# Patient Record
Sex: Female | Born: 1935 | Race: Black or African American | Hispanic: No | Smoking: Never smoker
Health system: Southern US, Community
[De-identification: ages and names within clinical notes are randomized; demographics above are authoritative.]

## PROBLEM LIST (undated history)

## (undated) DIAGNOSIS — I10 Essential (primary) hypertension: Secondary | ICD-10-CM

## (undated) DIAGNOSIS — R42 Dizziness and giddiness: Secondary | ICD-10-CM

## (undated) DIAGNOSIS — F0390 Unspecified dementia without behavioral disturbance: Secondary | ICD-10-CM

---

## 2018-06-01 ENCOUNTER — Emergency Department (HOSPITAL_COMMUNITY)
Admission: EM | Admit: 2018-06-01 | Discharge: 2018-06-02 | Disposition: A | Discharge status: Home / Self Care | Payer: Medicare Other | Attending: Emergency Medicine | Admitting: Emergency Medicine

## 2018-06-01 ENCOUNTER — Other Ambulatory Visit: Payer: Self-pay

## 2018-06-01 ENCOUNTER — Encounter (HOSPITAL_COMMUNITY): Payer: Self-pay | Admitting: Emergency Medicine

## 2018-06-01 DIAGNOSIS — N390 Urinary tract infection, site not specified: Secondary | ICD-10-CM | POA: Diagnosis not present

## 2018-06-01 DIAGNOSIS — I10 Essential (primary) hypertension: Secondary | ICD-10-CM | POA: Diagnosis not present

## 2018-06-01 DIAGNOSIS — R197 Diarrhea, unspecified: Secondary | ICD-10-CM | POA: Diagnosis present

## 2018-06-01 DIAGNOSIS — N3000 Acute cystitis without hematuria: Secondary | ICD-10-CM

## 2018-06-01 DIAGNOSIS — F0390 Unspecified dementia without behavioral disturbance: Secondary | ICD-10-CM | POA: Diagnosis not present

## 2018-06-01 HISTORY — DX: Essential (primary) hypertension: I10

## 2018-06-01 HISTORY — DX: Dizziness and giddiness: R42

## 2018-06-01 HISTORY — DX: Unspecified dementia, unspecified severity, without behavioral disturbance, psychotic disturbance, mood disturbance, and anxiety: F03.90

## 2018-06-01 LAB — URINALYSIS, ROUTINE W REFLEX MICROSCOPIC
Bilirubin Urine: NEGATIVE
Glucose, UA: NEGATIVE mg/dL
Ketones, ur: NEGATIVE mg/dL
Nitrite: NEGATIVE
Protein, ur: NEGATIVE mg/dL
Specific Gravity, Urine: 1.012 (ref 1.005–1.030)
WBC, UA: >50 WBC/hpf — ABNORMAL HIGH (ref 0–5)
pH: 5 (ref 5.0–8.0)

## 2018-06-01 NOTE — ED Notes (Signed)
Bed: EN40 Expected date:  Expected time:  Means of arrival:  Comments: EMS 83 yo female from SNF-diarrhea 130/72 CBG 103 Temp 100

## 2018-06-01 NOTE — ED Triage Notes (Signed)
Patient arrives by [facility] from [facility] stated foul smelling diarrhea that started today-went multiple times-denies any abd pain or vomiting-also complaining of increased swelling to lower extremities.

## 2018-06-01 NOTE — ED Provider Notes (Signed)
[facility]-EMERGENCY DEPT Provider Note   CSN: [id number] Arrival date & time: 06/01/18  2207    History   Chief Complaint Chief Complaint  Patient presents with  . Diarrhea    HPI Taylor Ruiz is a 83 y.o. female.     Patient to ED from [facility]) with complaint of diarrhea. She reports 2-3 episodes of copious, non-bloody stools today. She denies abdominal pain, known fever, nausea, vomiting. She states she has had a normal appetite today. No urinary symptoms. No respiratory symptoms. She is unsure whether or not she has been on any recent antibiotics, or had any medication changes.  Patient has documented history of dementia but is oriented to person and time here. Attempts to contact [facility] for additional history unsuccessful.  The history is provided by the patient and the EMS personnel. No language interpreter was used.  Diarrhea  Associated symptoms: no abdominal pain, no fever and no vomiting     Past Medical History:  Diagnosis Date  . Dementia (HCC)   . HTN (hypertension)   . Vertigo     There are no active problems to display for this patient.     OB History   No obstetric history on file.      Home Medications    Prior to Admission medications   Not on File    Family History No family history on file.  Social History Social History   Tobacco Use  . Smoking status: Not on file  Substance Use Topics  . Alcohol use: Not on file  . Drug use: Not on file     Allergies   Patient has no allergy information on record.   Review of Systems Review of Systems  Constitutional: Negative for fever.  HENT: Negative.   Respiratory: Negative.   Gastrointestinal: Positive for diarrhea. Negative for abdominal pain, nausea and vomiting.  Genitourinary: Negative for decreased urine volume and dysuria.  Neurological: Negative for weakness and light-headedness.     Physical Exam Updated Vital  Signs BP 131/64 (BP Location: Left Arm)   Pulse 94   Temp 99 F (37.2 C) (Rectal)   Resp 16   SpO2 99%   Physical Exam Vitals signs and nursing note reviewed.  Constitutional:      General: She is not in acute distress.    Appearance: She is not ill-appearing.  HENT:     Head: Normocephalic.     Mouth/Throat:     Mouth: Mucous membranes are moist.  Neck:     Musculoskeletal: Normal range of motion and neck supple.  Cardiovascular:     Rate and Rhythm: Normal rate and regular rhythm.     Heart sounds: No murmur.  Pulmonary:     Effort: Pulmonary effort is normal.     Breath sounds: No wheezing, rhonchi or rales.  Chest:     Chest wall: No tenderness.  Abdominal:     General: There is no distension.     Palpations: Abdomen is soft.     Tenderness: There is no abdominal tenderness. There is no guarding or rebound.  Musculoskeletal: Normal range of motion.  Skin:    General: Skin is warm and dry.  Neurological:     Mental Status: She is alert.      ED Treatments / Results  Labs (all labs ordered are listed, but only abnormal results are displayed) Labs Reviewed  URINE CULTURE  CBC WITH DIFFERENTIAL/PLATELET  COMPREHENSIVE METABOLIC PANEL  URINALYSIS,  ROUTINE W REFLEX MICROSCOPIC   Results for orders placed or performed during the hospital encounter of 06/01/18  CBC with Differential  Result Value Ref Range   WBC 8.0 4.0 - 10.5 K/uL   RBC 3.63 (L) 3.87 - 5.11 MIL/uL   Hemoglobin 11.4 (L) 12.0 - 15.0 g/dL   HCT 16.135.0 (L) 09.636.0 - 04.546.0 %   MCV 96.4 80.0 - 100.0 fL   MCH 31.4 26.0 - 34.0 pg   MCHC 32.6 30.0 - 36.0 g/dL   RDW 40.914.6 81.111.5 - 91.415.5 %   Platelets 217 150 - 400 K/uL   nRBC 0.0 0.0 - 0.2 %   Neutrophils Relative % 68 %   Neutro Abs 5.4 1.7 - 7.7 K/uL   Lymphocytes Relative 21 %   Lymphs Abs 1.6 0.7 - 4.0 K/uL   Monocytes Relative 8 %   Monocytes Absolute 0.7 0.1 - 1.0 K/uL   Eosinophils Relative 2 %   Eosinophils Absolute 0.2 0.0 - 0.5 K/uL    Basophils Relative 0 %   Basophils Absolute 0.0 0.0 - 0.1 K/uL   Immature Granulocytes 1 %   Abs Immature Granulocytes 0.04 0.00 - 0.07 K/uL  Comprehensive metabolic panel  Result Value Ref Range   Sodium 140 135 - 145 mmol/L   Potassium 3.6 3.5 - 5.1 mmol/L   Chloride 109 98 - 111 mmol/L   CO2 21 (L) 22 - 32 mmol/L   Glucose, Bld 85 70 - 99 mg/dL   BUN 13 8 - 23 mg/dL   Creatinine, Ser 7.820.88 0.44 - 1.00 mg/dL   Calcium 9.2 8.9 - 95.610.3 mg/dL   Total Protein 7.2 6.5 - 8.1 g/dL   Albumin 3.3 (L) 3.5 - 5.0 g/dL   AST 14 (L) 15 - 41 U/L   ALT 11 0 - 44 U/L   Alkaline Phosphatase 50 38 - 126 U/L   Total Bilirubin 0.6 0.3 - 1.2 mg/dL   GFR calc non Af Amer >60 >60 mL/min   GFR calc Af Amer >60 >60 mL/min   Anion gap 10 5 - 15  Urinalysis, Routine w reflex microscopic  Result Value Ref Range   Color, Urine YELLOW YELLOW   APPearance HAZY (A) CLEAR   Specific Gravity, Urine 1.012 1.005 - 1.030   pH 5.0 5.0 - 8.0   Glucose, UA NEGATIVE NEGATIVE mg/dL   Hgb urine dipstick SMALL (A) NEGATIVE   Bilirubin Urine NEGATIVE NEGATIVE   Ketones, ur NEGATIVE NEGATIVE mg/dL   Protein, ur NEGATIVE NEGATIVE mg/dL   Nitrite NEGATIVE NEGATIVE   Leukocytes,Ua LARGE (A) NEGATIVE   RBC / HPF 0-5 0 - 5 RBC/hpf   WBC, UA >50 (H) 0 - 5 WBC/hpf   Bacteria, UA MANY (A) NONE SEEN   Squamous Epithelial / LPF 0-5 0 - 5   Mucus PRESENT     EKG None  Radiology No results found.  Procedures Procedures (including critical care time)  Medications Ordered in ED Medications - No data to display   Initial Impression / Assessment and Plan / ED Course  I have reviewed the triage vital signs and the nursing notes.  Pertinent labs & imaging results that were available during my care of the patient were reviewed by me and considered in my medical decision making (see chart for details).        Patient to ED from [facility] with complaint of diarrhea. She reports having 2 episodes of non-bloody stools today. No  reported fever, nausea or vomiting.  The patient is alert and very pleasant. She is oriented and the history is felt reliable. Her exam is benign.   She is found to have a urinary tract infection which has been treated here with Rocephin. Other labs are non-concerning and do not suggest concern for sepsis, dehydration.   The patient was updated on results and plan for discharge back to [facility]. Of note, Chip Boer was called earlier tonight on patient's arrival to gain collateral information and I was prompted to leave a message "requiring immediate call back". I never received any call from any staff member regarding this patient.  Final Clinical Impressions(s) / ED Diagnoses   Final diagnoses:  None   1. UTI  ED Discharge Orders    None       Danne Harbor 06/02/18 0247    Benjiman Core, MD 06/02/18 2321

## 2018-06-01 NOTE — ED Notes (Signed)
Taylor Ruiz (cousin) [phone]

## 2018-06-02 DIAGNOSIS — N390 Urinary tract infection, site not specified: Secondary | ICD-10-CM | POA: Diagnosis not present

## 2018-06-02 LAB — CBC WITH DIFFERENTIAL/PLATELET
Abs Immature Granulocytes: 0.04 10*3/uL (ref 0.00–0.07)
Basophils Absolute: 0 10*3/uL (ref 0.0–0.1)
Basophils Relative: 0 %
Eosinophils Absolute: 0.2 10*3/uL (ref 0.0–0.5)
Eosinophils Relative: 2 %
HCT: 35 % — ABNORMAL LOW (ref 36.0–46.0)
Hemoglobin: 11.4 g/dL — ABNORMAL LOW (ref 12.0–15.0)
Immature Granulocytes: 1 %
Lymphocytes Relative: 21 %
Lymphs Abs: 1.6 10*3/uL (ref 0.7–4.0)
MCH: 31.4 pg (ref 26.0–34.0)
MCHC: 32.6 g/dL (ref 30.0–36.0)
MCV: 96.4 fL (ref 80.0–100.0)
Monocytes Absolute: 0.7 10*3/uL (ref 0.1–1.0)
Monocytes Relative: 8 %
Neutro Abs: 5.4 10*3/uL (ref 1.7–7.7)
Neutrophils Relative %: 68 %
Platelets: 217 10*3/uL (ref 150–400)
RBC: 3.63 MIL/uL — ABNORMAL LOW (ref 3.87–5.11)
RDW: 14.6 % (ref 11.5–15.5)
WBC: 8 10*3/uL (ref 4.0–10.5)
nRBC: 0 % (ref 0.0–0.2)

## 2018-06-02 LAB — COMPREHENSIVE METABOLIC PANEL
ALT: 11 U/L (ref 0–44)
AST: 14 U/L — ABNORMAL LOW (ref 15–41)
Albumin: 3.3 g/dL — ABNORMAL LOW (ref 3.5–5.0)
Alkaline Phosphatase: 50 U/L (ref 38–126)
Anion gap: 10 (ref 5–15)
BUN: 13 mg/dL (ref 8–23)
CO2: 21 mmol/L — ABNORMAL LOW (ref 22–32)
Calcium: 9.2 mg/dL (ref 8.9–10.3)
Chloride: 109 mmol/L (ref 98–111)
Creatinine, Ser: 0.88 mg/dL (ref 0.44–1.00)
GFR calc Af Amer: >60 mL/min (ref >60)
GFR calc non Af Amer: >60 mL/min (ref >60)
Glucose, Bld: 85 mg/dL (ref 70–99)
Potassium: 3.6 mmol/L (ref 3.5–5.1)
Sodium: 140 mmol/L (ref 135–145)
Total Bilirubin: 0.6 mg/dL (ref 0.3–1.2)
Total Protein: 7.2 g/dL (ref 6.5–8.1)

## 2018-06-02 MED ORDER — CEPHALEXIN 500 MG PO CAPS: 500.0000 mg | ORAL_CAPSULE | Freq: Three times a day (TID) | ORAL | 0 refills | Status: DC

## 2018-06-02 MED ORDER — SODIUM CHLORIDE 0.9 % IV SOLN
1.0000 g | Freq: Once | INTRAVENOUS | Status: AC
  Administered 2018-06-02: 1 g via INTRAVENOUS
  Filled 2018-06-02: qty 10

## 2018-06-02 NOTE — ED Notes (Addendum)
[facility] called for transport. Attempted to [place] [facility] [address], but got no answer. No episodes of diarrhea noted while at the facility.

## 2018-06-03 LAB — URINE CULTURE

## 2018-06-09 ENCOUNTER — Emergency Department (HOSPITAL_COMMUNITY): Payer: Medicare Other

## 2018-06-09 ENCOUNTER — Emergency Department (HOSPITAL_COMMUNITY)
Admission: EM | Admit: 2018-06-09 | Discharge: 2018-06-09 | Disposition: A | Discharge status: Skilled Nursing Facility | Payer: Medicare Other | Attending: Emergency Medicine | Admitting: Emergency Medicine

## 2018-06-09 ENCOUNTER — Encounter (HOSPITAL_COMMUNITY): Payer: Self-pay | Admitting: Physician Assistant

## 2018-06-09 DIAGNOSIS — I10 Essential (primary) hypertension: Secondary | ICD-10-CM | POA: Insufficient documentation

## 2018-06-09 DIAGNOSIS — R238 Other skin changes: Secondary | ICD-10-CM | POA: Diagnosis not present

## 2018-06-09 DIAGNOSIS — Y999 Unspecified external cause status: Secondary | ICD-10-CM | POA: Insufficient documentation

## 2018-06-09 DIAGNOSIS — S0990XA Unspecified injury of head, initial encounter: Secondary | ICD-10-CM | POA: Diagnosis present

## 2018-06-09 DIAGNOSIS — Y939 Activity, unspecified: Secondary | ICD-10-CM | POA: Diagnosis not present

## 2018-06-09 DIAGNOSIS — W19XXXA Unspecified fall, initial encounter: Secondary | ICD-10-CM

## 2018-06-09 DIAGNOSIS — Z79899 Other long term (current) drug therapy: Secondary | ICD-10-CM | POA: Diagnosis not present

## 2018-06-09 DIAGNOSIS — S199XXA Unspecified injury of neck, initial encounter: Secondary | ICD-10-CM | POA: Insufficient documentation

## 2018-06-09 DIAGNOSIS — Y929 Unspecified place or not applicable: Secondary | ICD-10-CM | POA: Diagnosis not present

## 2018-06-09 DIAGNOSIS — W07XXXA Fall from chair, initial encounter: Secondary | ICD-10-CM | POA: Diagnosis not present

## 2018-06-09 DIAGNOSIS — L988 Other specified disorders of the skin and subcutaneous tissue: Secondary | ICD-10-CM

## 2018-06-09 DIAGNOSIS — F0390 Unspecified dementia without behavioral disturbance: Secondary | ICD-10-CM | POA: Diagnosis not present

## 2018-06-09 LAB — CBC WITH DIFFERENTIAL/PLATELET
Abs Immature Granulocytes: 0.04 10*3/uL (ref 0.00–0.07)
Basophils Absolute: 0.1 10*3/uL (ref 0.0–0.1)
Basophils Relative: 1 %
Eosinophils Absolute: 0.1 10*3/uL (ref 0.0–0.5)
Eosinophils Relative: 1 %
HCT: 40.4 % (ref 36.0–46.0)
Hemoglobin: 12.8 g/dL (ref 12.0–15.0)
Immature Granulocytes: 0 %
Lymphocytes Relative: 16 %
Lymphs Abs: 1.5 10*3/uL (ref 0.7–4.0)
MCH: 30.5 pg (ref 26.0–34.0)
MCHC: 31.7 g/dL (ref 30.0–36.0)
MCV: 96.2 fL (ref 80.0–100.0)
Monocytes Absolute: 0.7 10*3/uL (ref 0.1–1.0)
Monocytes Relative: 8 %
Neutro Abs: 7.1 10*3/uL (ref 1.7–7.7)
Neutrophils Relative %: 74 %
Platelets: 183 10*3/uL (ref 150–400)
RBC: 4.2 MIL/uL (ref 3.87–5.11)
RDW: 14.2 % (ref 11.5–15.5)
WBC: 9.6 10*3/uL (ref 4.0–10.5)
nRBC: 0 % (ref 0.0–0.2)

## 2018-06-09 LAB — COMPREHENSIVE METABOLIC PANEL
ALT: 23 U/L (ref 0–44)
AST: 40 U/L (ref 15–41)
Albumin: 3.1 g/dL — ABNORMAL LOW (ref 3.5–5.0)
Alkaline Phosphatase: 48 U/L (ref 38–126)
Anion gap: 9 (ref 5–15)
BUN: 15 mg/dL (ref 8–23)
CO2: 24 mmol/L (ref 22–32)
Calcium: 9 mg/dL (ref 8.9–10.3)
Chloride: 110 mmol/L (ref 98–111)
Creatinine, Ser: 0.95 mg/dL (ref 0.44–1.00)
GFR calc Af Amer: >60 mL/min (ref >60)
GFR calc non Af Amer: 57 mL/min — ABNORMAL LOW (ref >60)
Glucose, Bld: 100 mg/dL — ABNORMAL HIGH (ref 70–99)
Potassium: 3.3 mmol/L — ABNORMAL LOW (ref 3.5–5.1)
Sodium: 143 mmol/L (ref 135–145)
Total Bilirubin: 0.7 mg/dL (ref 0.3–1.2)
Total Protein: 7.1 g/dL (ref 6.5–8.1)

## 2018-06-09 LAB — URINALYSIS, ROUTINE W REFLEX MICROSCOPIC
Bilirubin Urine: NEGATIVE
Glucose, UA: NEGATIVE mg/dL
Hgb urine dipstick: NEGATIVE
Ketones, ur: 5 mg/dL — AB
Leukocytes,Ua: NEGATIVE
Nitrite: NEGATIVE
Protein, ur: NEGATIVE mg/dL
Specific Gravity, Urine: 1.018 (ref 1.005–1.030)
pH: 6 (ref 5.0–8.0)

## 2018-06-09 LAB — TROPONIN I: Troponin I: <0.03 ng/mL (ref <0.03)

## 2018-06-09 MED ORDER — SODIUM CHLORIDE 0.9 % IV BOLUS
250.0000 mL | Freq: Once | INTRAVENOUS | Status: AC
  Administered 2018-06-09: 250 mL via INTRAVENOUS

## 2018-06-09 NOTE — ED Triage Notes (Signed)
PT BIBA from [facility] d/t witnessed fall yesterday from her wheelchair to the ground.  Staff this AM found abrasions to left inner thigh and lower left buttock.  Pt c/o lower back pain.   Staff uncertain as to whether she hit her head.  Pt has hx dementia. Pt does not take blood thinners.  Staff reports pt is at her baseline.  Denies LOC.

## 2018-06-09 NOTE — ED Notes (Signed)
In/out cath performed on pt, with 2nd RN present. Able to collect less than 1 mL of urine. PA Lanora Manis made aware.

## 2018-06-09 NOTE — Discharge Instructions (Signed)
Today the urine does not show any evidence of infection.  X-rays do not show any acute findings.  Please follow-up with primary care doctor.  Please make sure that the area around your brief is clean and dry.  Please follow-up with your primary care doctor.

## 2018-06-09 NOTE — ED Notes (Signed)
Bed: WA09 Expected date:  Expected time:  Means of arrival:  Comments: 83 yo fall, back pain

## 2018-06-09 NOTE — ED Notes (Signed)
Pt changed and ready for [facility]

## 2018-06-09 NOTE — ED Notes (Signed)
[facility] called for transport.  

## 2018-06-09 NOTE — ED Notes (Signed)
Lab notified staff that urine obtained was an inadequate volume to perform UA or urine culture testing.  PA Lanora Manis made aware.

## 2018-06-09 NOTE — ED Provider Notes (Signed)
[facility]-EMERGENCY DEPT Provider Note   CSN: [id number] Arrival date & time: 06/09/18  1332    History   Chief Complaint Chief Complaint  Patient presents with   Fall    HPI Glendora Clouatre is a 83 y.o. female with a past medical history of dementia, hypertension, seen here 7 days ago for UTI/diarrhea, who presents today for evaluation after a fall.  Patient reports that she was sitting in a chair yesterday and fell to the ground.  She is unsure why she fell.  According to EMS staff sent her here as they saw abrasions on her left inner thigh and lower left buttock.  Patient states that she did hit her head.  She reports pain in her upper and lower back along with both of her hips.  She denies any vision changes.  She denies any abdominal pain, chest pain, nausea vomiting or diarrhea.     HPI  Past Medical History:  Diagnosis Date   Dementia (HCC)    HTN (hypertension)    Vertigo     There are no active problems to display for this patient.   History reviewed. No pertinent surgical history.   OB History   No obstetric history on file.      Home Medications    Prior to Admission medications   Medication Sig Start Date End Date Taking? Authorizing Provider  acetaminophen (TYLENOL) 325 MG tablet Take 650 mg by mouth every 6 (six) hours as needed for mild pain, moderate pain, fever or headache.   Yes [provider]  amLODipine (NORVASC) 10 MG tablet Take 10 mg by mouth daily.   Yes [provider]  Brexpiprazole (REXULTI) 0.5 MG TABS Take 1 mg by mouth at bedtime.   Yes [provider]  cephALEXin (KEFLEX) 500 MG capsule Take 1 capsule (500 mg total) by mouth 3 (three) times daily. 06/02/18  Yes Upstill, Shari, PA-C  donepezil (ARICEPT) 5 MG tablet Take 5 mg by mouth at bedtime.   Yes [provider]  furosemide (LASIX) 20 MG tablet Take 20 mg by mouth daily.   Yes [provider]  meclizine  (ANTIVERT) 25 MG tablet Take 25 mg by mouth 2 (two) times daily.   Yes [provider]  pregabalin (LYRICA) 50 MG capsule Take 50 mg by mouth at bedtime.   Yes [provider]  valACYclovir (VALTREX) 500 MG tablet Take 500 mg by mouth 2 (two) times daily.   Yes [provider]    Family History History reviewed. No pertinent family history.  Social History Social History   Tobacco Use   Smoking status: Not on file  Substance Use Topics   Alcohol use: Not on file   Drug use: Not on file     Allergies   Patient has no known allergies.   Review of Systems Review of Systems  Constitutional: Negative for chills and fever.  Respiratory: Negative for chest tightness and shortness of breath.   Cardiovascular: Negative for chest pain.  Genitourinary: Negative for dysuria.  Musculoskeletal: Positive for back pain and neck pain.  Neurological: Negative for dizziness, speech difficulty and headaches.  All other systems reviewed and are negative.    Physical Exam Updated Vital Signs BP (!) 107/58    Pulse 91    Temp 98.9 F (37.2 C) (Oral)    Resp 16    SpO2 99%   Physical Exam Vitals signs and nursing note reviewed.  Constitutional:  General: She is not in acute distress.    Appearance: She is well-developed.  HENT:     Head: Normocephalic.     Comments: Small ecchymosis under left eye.     Nose: Nose normal.     Mouth/Throat:     Mouth: Mucous membranes are moist.  Eyes:     Conjunctiva/sclera: Conjunctivae normal.  Neck:     Musculoskeletal: Normal range of motion and neck supple.  Cardiovascular:     Rate and Rhythm: Normal rate and regular rhythm.     Heart sounds: No murmur.  Pulmonary:     Effort: Pulmonary effort is normal. No respiratory distress.     Breath sounds: Normal breath sounds.  Abdominal:     Palpations: Abdomen is soft.     Tenderness: There is no abdominal tenderness.  Musculoskeletal:     Comments: There is  diffuse tenderness to palpation over the mid to lower back.  There is midline tenderness to palpation, however no step-offs or deformities.  Pain is poorly localized.  There is pain with straight leg roll on the right hip and left hip.  No pain with pelvic compression.  Skin:    General: Skin is warm and dry.     Comments: Please see clinical images.  There are superficial/shallow ulcerations present on the right proximal medial thigh and on the left posterior leg/left lower buttock.  There is foul smell with thick, cheesy material in the skin fold around her brief.   Neurological:     General: No focal deficit present.     Mental Status: She is alert.  Psychiatric:        Mood and Affect: Mood normal.        Behavior: Behavior normal.      Posterior, left side is up.     Anterior    ED Treatments / Results  Labs (all labs ordered are listed, but only abnormal results are displayed) Labs Reviewed  URINALYSIS, ROUTINE W REFLEX MICROSCOPIC - Abnormal; Notable for the following components:      Result Value   APPearance HAZY (*)    Ketones, ur 5 (*)    All other components within normal limits  COMPREHENSIVE METABOLIC PANEL - Abnormal; Notable for the following components:   Potassium 3.3 (*)    Glucose, Bld 100 (*)    Albumin 3.1 (*)    GFR calc non Af Amer 57 (*)    All other components within normal limits  URINE CULTURE  CBC WITH DIFFERENTIAL/PLATELET  TROPONIN I    EKG EKG Interpretation  Date/Time:  Sunday June 09 2018 13:47:47 EDT Ventricular Rate:  96 PR Interval:    QRS Duration: 90 QT Interval:  352 QTC Calculation: 445 R Axis:   78 Text Interpretation:  Sinus rhythm Confirmed by Benjiman Core [phone]) on 06/09/2018 2:00:03 PM   Radiology Dg Thoracic Spine 2 View  Result Date: 06/09/2018 CLINICAL DATA:  Acute mid back pain following fall yesterday. Initial encounter. EXAM: THORACIC SPINE 2 VIEWS COMPARISON:  None. FINDINGS: There is no evidence of  thoracic spine fracture. Alignment is normal. No other significant bone abnormalities are identified. IMPRESSION: Negative. Electronically Signed   By: Harmon Pier M.D.   On: 06/09/2018 14:54   Dg Lumbar Spine Complete  Result Date: 06/09/2018 CLINICAL DATA:  Acute low back pain following fall yesterday. Initial encounter. EXAM: LUMBAR SPINE - COMPLETE 4+ VIEW COMPARISON:  None. FINDINGS: There is no evidence of acute fracture or subluxation. Mild-to-moderate  degenerative disc disease/spondylosis noted, greatest at L4-5. Moderate facet arthropathy in the LOWER lumbar spine noted. No focal bony lesions or spondylolysis noted. Aortic atherosclerotic calcifications noted. IMPRESSION: 1. No acute abnormality 2. Mild-to-moderate multilevel degenerative changes. 3.  Aortic Atherosclerosis (ICD10-I70.0). Electronically Signed   By: Harmon Pier M.D.   On: 06/09/2018 14:52   Ct Head Wo Contrast  Result Date: 06/09/2018 CLINICAL DATA:  83 year old female with acute head and neck injury following fall yesterday. Initial encounter. EXAM: CT HEAD WITHOUT CONTRAST CT CERVICAL SPINE WITHOUT CONTRAST TECHNIQUE: Multidetector CT imaging of the head and cervical spine was performed following the standard protocol without intravenous contrast. Multiplanar CT image reconstructions of the cervical spine were also generated. COMPARISON:  None. FINDINGS: CT HEAD FINDINGS Brain: No evidence of acute infarction, hemorrhage, hydrocephalus, extra-axial collection or mass lesion/mass effect. Mild atrophy and chronic small-vessel white matter ischemic changes noted. Vascular: Carotid atherosclerotic calcifications noted. Skull: Normal. Negative for fracture or focal lesion. Sinuses/Orbits: No acute finding. Other: None. CT CERVICAL SPINE FINDINGS Alignment: Normal. Skull base and vertebrae: No acute fracture. No primary bone lesion or focal pathologic process. Soft tissues and spinal canal: No prevertebral fluid or swelling. No visible  canal hematoma. Disc levels: Mild-to-moderate degenerative disc disease/spondylosis from C4-C7 noted contributing to mild to moderate central spinal narrowing. Upper chest: No acute abnormality Other: None IMPRESSION: 1. No evidence of acute intracranial abnormality. Mild atrophy and chronic small-vessel white matter ischemic changes. 2. No static evidence of acute injury to the cervical spine. Mild to moderate degenerative changes from C4-C7. Electronically Signed   By: Harmon Pier M.D.   On: 06/09/2018 14:46   Ct Cervical Spine Wo Contrast  Result Date: 06/09/2018 CLINICAL DATA:  83 year old female with acute head and neck injury following fall yesterday. Initial encounter. EXAM: CT HEAD WITHOUT CONTRAST CT CERVICAL SPINE WITHOUT CONTRAST TECHNIQUE: Multidetector CT imaging of the head and cervical spine was performed following the standard protocol without intravenous contrast. Multiplanar CT image reconstructions of the cervical spine were also generated. COMPARISON:  None. FINDINGS: CT HEAD FINDINGS Brain: No evidence of acute infarction, hemorrhage, hydrocephalus, extra-axial collection or mass lesion/mass effect. Mild atrophy and chronic small-vessel white matter ischemic changes noted. Vascular: Carotid atherosclerotic calcifications noted. Skull: Normal. Negative for fracture or focal lesion. Sinuses/Orbits: No acute finding. Other: None. CT CERVICAL SPINE FINDINGS Alignment: Normal. Skull base and vertebrae: No acute fracture. No primary bone lesion or focal pathologic process. Soft tissues and spinal canal: No prevertebral fluid or swelling. No visible canal hematoma. Disc levels: Mild-to-moderate degenerative disc disease/spondylosis from C4-C7 noted contributing to mild to moderate central spinal narrowing. Upper chest: No acute abnormality Other: None IMPRESSION: 1. No evidence of acute intracranial abnormality. Mild atrophy and chronic small-vessel white matter ischemic changes. 2. No static  evidence of acute injury to the cervical spine. Mild to moderate degenerative changes from C4-C7. Electronically Signed   By: Harmon Pier M.D.   On: 06/09/2018 14:46   Dg Hip Unilat With Pelvis 2-3 Views Left  Result Date: 06/09/2018 CLINICAL DATA:  Acute LEFT hip pain following fall yesterday. Initial encounter. EXAM: DG HIP (WITH OR WITHOUT PELVIS) 2-3V LEFT COMPARISON:  None. FINDINGS: There is no evidence of hip fracture or dislocation. There is no evidence of arthropathy or other focal bone abnormality. Degenerative changes in the LOWER lumbar spine noted. IMPRESSION: No acute abnormality. Electronically Signed   By: Harmon Pier M.D.   On: 06/09/2018 14:53    Procedures Procedures (including critical care  time)  Medications Ordered in ED Medications  sodium chloride 0.9 % bolus 250 mL (0 mLs Intravenous Stopped 06/09/18 1725)     Initial Impression / Assessment and Plan / ED Course  I have reviewed the triage vital signs and the nursing notes.  Pertinent labs & imaging results that were available during my care of the patient were reviewed by me and considered in my medical decision making (see chart for details).  Clinical Course as of Jun 09 1926  Sun Jun 09, 2018  1638 Patient has yet to urinate despite PO fluids.  Ordered small bolus of 250 ml, RN to push PO fluids.  Given recent UTI diagnosis Need to check urine before d/c.    [EH]    Clinical Course User Index [EH] Cristina GongHammond, Alanii Ramer W, PA-C      Patient presents today for evaluation after a fall that occurred yesterday at her facility when she fell out of her chair onto the floor.  Staff sent her today as she had bruising noted on her leg and buttocks.  The lesion appears to be skin breakdown secondary to maceration.  The patient tells me that she feels safe where she is living and no one has done anything to harm her.  Unsure why she fell.  CT head and neck were obtained without evidence of acute abnormalities.  She had hip  pain, and back pain.  Plain films of T and L-spine along with pelvis and left hip were obtained without evidence of acute abnormality.  Her urine does not show evidence of infection at this time.  Urine culture was sent.  EKG does not show evidence of acute abnormality.  Labs appear consistent with her baseline.  This patient was seen as a shared visit with Dr. Rubin PayorPickering.  Patient will be discharged home.  Return precautions were discussed with patient who states their understanding.  At the time of discharge patient denied any unaddressed complaints or concerns.  Patient is agreeable for discharge home.   Final Clinical Impressions(s) / ED Diagnoses   Final diagnoses:  Fall, initial encounter  Skin maceration    ED Discharge Orders    None       Norman ClayHammond, Summers Buendia W, PA-C 06/09/18 Lawanna [id number]    Pickering, Nathan, MD 06/12/18 [phone]

## 2018-06-11 LAB — URINE CULTURE: Culture: 30000 — AB

## 2018-06-12 ENCOUNTER — Telehealth: Payer: Self-pay | Admitting: *Deleted

## 2018-06-12 NOTE — Telephone Encounter (Signed)
Post ED Visit - Positive Culture Follow-up  Culture report reviewed by antimicrobial stewardship pharmacist: [facility] []  Enzo Bi, Pharm.D. []  Celedonio Miyamoto, Pharm.D., BCPS AQ-ID []  Garvin Fila, Pharm.D., BCPS []  Georgina Pillion, Pharm.D., BCPS []  [place], [address].D., BCPS, AAHIVP []  Estella Husk, Pharm.D., BCPS, AAHIVP []  Lysle Pearl, PharmD, BCPS []  Phillips Climes, PharmD, BCPS []  Agapito Games, PharmD, BCPS []  Verlan Friends, PharmD []  Mervyn Gay, PharmD, BCPS []  Vinnie Level, PharmD  [facility] []  Len Childs, PharmD []  Greer Pickerel, PharmD []  Adalberto Cole, PharmD []  Perlie Gold, Rph []  Lonell Face) Jean Rosenthal, PharmD []  Earl Many, PharmD []  Junita Push, PharmD []  Dorna Leitz, PharmD []  Terrilee Files, PharmD []  Lynann Beaver, PharmD []  Keturah Barre, PharmD [x]  Loralee Pacas, PharmD []  Bernadene Person, PharmD   Positive urine culture, reviewed by Philis Kendall, PA-C No further patient follow-up is required at this time.  Virl [facility] 06/12/2018, 11:39 AM

## 2018-06-24 ENCOUNTER — Emergency Department (HOSPITAL_COMMUNITY): Payer: Medicare Other

## 2018-06-24 ENCOUNTER — Other Ambulatory Visit: Payer: Self-pay

## 2018-06-24 ENCOUNTER — Encounter (HOSPITAL_COMMUNITY): Payer: Self-pay | Admitting: Family Medicine

## 2018-06-24 ENCOUNTER — Observation Stay (HOSPITAL_COMMUNITY)
Admission: EM | Admit: 2018-06-24 | Discharge: 2018-06-25 | Disposition: A | Discharge status: Home / Self Care | Payer: Medicare Other | Attending: Internal Medicine | Admitting: Internal Medicine

## 2018-06-24 DIAGNOSIS — Z1159 Encounter for screening for other viral diseases: Secondary | ICD-10-CM | POA: Insufficient documentation

## 2018-06-24 DIAGNOSIS — I10 Essential (primary) hypertension: Secondary | ICD-10-CM | POA: Diagnosis not present

## 2018-06-24 DIAGNOSIS — E876 Hypokalemia: Secondary | ICD-10-CM | POA: Insufficient documentation

## 2018-06-24 DIAGNOSIS — R7989 Other specified abnormal findings of blood chemistry: Secondary | ICD-10-CM | POA: Insufficient documentation

## 2018-06-24 DIAGNOSIS — R55 Syncope and collapse: Secondary | ICD-10-CM

## 2018-06-24 DIAGNOSIS — N289 Disorder of kidney and ureter, unspecified: Secondary | ICD-10-CM | POA: Diagnosis not present

## 2018-06-24 DIAGNOSIS — M50321 Other cervical disc degeneration at C4-C5 level: Secondary | ICD-10-CM | POA: Diagnosis not present

## 2018-06-24 DIAGNOSIS — I951 Orthostatic hypotension: Principal | ICD-10-CM | POA: Insufficient documentation

## 2018-06-24 DIAGNOSIS — M5136 Other intervertebral disc degeneration, lumbar region: Secondary | ICD-10-CM | POA: Diagnosis not present

## 2018-06-24 DIAGNOSIS — F0390 Unspecified dementia without behavioral disturbance: Secondary | ICD-10-CM | POA: Insufficient documentation

## 2018-06-24 DIAGNOSIS — R778 Other specified abnormalities of plasma proteins: Secondary | ICD-10-CM

## 2018-06-24 DIAGNOSIS — Z79899 Other long term (current) drug therapy: Secondary | ICD-10-CM | POA: Diagnosis not present

## 2018-06-24 DIAGNOSIS — I700 Atherosclerosis of aorta: Secondary | ICD-10-CM | POA: Diagnosis not present

## 2018-06-24 LAB — CBC WITH DIFFERENTIAL/PLATELET
Abs Immature Granulocytes: 0.03 10*3/uL (ref 0.00–0.07)
Basophils Absolute: 0 10*3/uL (ref 0.0–0.1)
Basophils Relative: 0 %
Eosinophils Absolute: 0 10*3/uL (ref 0.0–0.5)
Eosinophils Relative: 1 %
HCT: 36 % (ref 36.0–46.0)
Hemoglobin: 11.7 g/dL — ABNORMAL LOW (ref 12.0–15.0)
Immature Granulocytes: 0 %
Lymphocytes Relative: 11 %
Lymphs Abs: 0.9 10*3/uL (ref 0.7–4.0)
MCH: 31 pg (ref 26.0–34.0)
MCHC: 32.5 g/dL (ref 30.0–36.0)
MCV: 95.5 fL (ref 80.0–100.0)
Monocytes Absolute: 0.5 10*3/uL (ref 0.1–1.0)
Monocytes Relative: 6 %
Neutro Abs: 6.9 10*3/uL (ref 1.7–7.7)
Neutrophils Relative %: 82 %
Platelets: 189 10*3/uL (ref 150–400)
RBC: 3.77 MIL/uL — ABNORMAL LOW (ref 3.87–5.11)
RDW: 14.3 % (ref 11.5–15.5)
WBC: 8.5 10*3/uL (ref 4.0–10.5)
nRBC: 0 % (ref 0.0–0.2)

## 2018-06-24 LAB — BASIC METABOLIC PANEL
Anion gap: 9 (ref 5–15)
BUN: 14 mg/dL (ref 8–23)
CO2: 21 mmol/L — ABNORMAL LOW (ref 22–32)
Calcium: 9.2 mg/dL (ref 8.9–10.3)
Chloride: 107 mmol/L (ref 98–111)
Creatinine, Ser: 1.03 mg/dL — ABNORMAL HIGH (ref 0.44–1.00)
GFR calc Af Amer: 59 mL/min — ABNORMAL LOW (ref >60)
GFR calc non Af Amer: 51 mL/min — ABNORMAL LOW (ref >60)
Glucose, Bld: 113 mg/dL — ABNORMAL HIGH (ref 70–99)
Potassium: 3.3 mmol/L — ABNORMAL LOW (ref 3.5–5.1)
Sodium: 137 mmol/L (ref 135–145)

## 2018-06-24 LAB — TROPONIN I: Troponin I: 0.22 ng/mL (ref <0.03)

## 2018-06-24 MED ORDER — SODIUM CHLORIDE 0.9% FLUSH: 3.0000 mL | Freq: Two times a day (BID) | INTRAVENOUS | Status: DC

## 2018-06-24 MED ORDER — POTASSIUM CHLORIDE IN NACL 20-0.9 MEQ/L-% IV SOLN
INTRAVENOUS | Status: AC
  Administered 2018-06-25: 01:00:00 via INTRAVENOUS
  Filled 2018-06-24: qty 1000

## 2018-06-24 MED ORDER — ACETAMINOPHEN 650 MG RE SUPP: 650.0000 mg | Freq: Four times a day (QID) | RECTAL | Status: DC | PRN

## 2018-06-24 MED ORDER — SODIUM CHLORIDE 0.9% FLUSH
3.0000 mL | Freq: Two times a day (BID) | INTRAVENOUS | Status: DC
  Administered 2018-06-25: 3 mL via INTRAVENOUS

## 2018-06-24 MED ORDER — POTASSIUM CHLORIDE CRYS ER 20 MEQ PO TBCR
20.0000 meq | EXTENDED_RELEASE_TABLET | Freq: Once | ORAL | Status: AC
  Administered 2018-06-25: 20 meq via ORAL
  Filled 2018-06-24: qty 1

## 2018-06-24 MED ORDER — ASPIRIN 81 MG PO CHEW
324.0000 mg | CHEWABLE_TABLET | Freq: Once | ORAL | Status: AC
  Administered 2018-06-25: 324 mg via ORAL
  Filled 2018-06-24: qty 4

## 2018-06-24 MED ORDER — SODIUM CHLORIDE 0.9 % IV SOLN: 250.0000 mL | INTRAVENOUS | Status: DC | PRN

## 2018-06-24 MED ORDER — ONDANSETRON HCL 4 MG/2ML IJ SOLN: 4.0000 mg | Freq: Four times a day (QID) | INTRAMUSCULAR | Status: DC | PRN

## 2018-06-24 MED ORDER — ACETAMINOPHEN 325 MG PO TABS: 650.0000 mg | ORAL_TABLET | Freq: Four times a day (QID) | ORAL | Status: DC | PRN

## 2018-06-24 MED ORDER — ENOXAPARIN SODIUM 40 MG/0.4ML ~~LOC~~ SOLN
40.0000 mg | SUBCUTANEOUS | Status: DC
  Administered 2018-06-25: 40 mg via SUBCUTANEOUS
  Filled 2018-06-24: qty 0.4

## 2018-06-24 MED ORDER — SODIUM CHLORIDE 0.9% FLUSH: 3.0000 mL | INTRAVENOUS | Status: DC | PRN

## 2018-06-24 MED ORDER — ONDANSETRON HCL 4 MG PO TABS: 4.0000 mg | ORAL_TABLET | Freq: Four times a day (QID) | ORAL | Status: DC | PRN

## 2018-06-24 MED ORDER — SODIUM CHLORIDE 0.9 % IV BOLUS
250.0000 mL | Freq: Once | INTRAVENOUS | Status: AC
  Administered 2018-06-25: 250 mL via INTRAVENOUS

## 2018-06-24 NOTE — ED Provider Notes (Signed)
[facility]-EMERGENCY DEPT Provider Note   CSN: [id number] Arrival date & time: 06/24/18  2142    History   Chief Complaint Chief Complaint  Patient presents with  . Loss of Consciousness    HPI Taylor Ruiz is a 83 y.o. female.     The history is provided by the patient and the EMS personnel.    LEVEL V CAVEAT:  DEMENTIA 83 year old female with history of dementia, hypertension, vertigo, presenting to the ED after a syncopal event.  Patient is from [facility] facility and this evening staff was trying to transfer her from her wheelchair into her bed and she had a syncopal event.  There was no fall to the ground or head trauma.  Patient states she does not exactly remember this happening but she does remember feeling dizzy prior to this, like room was spinning.  She denies any nausea or vomiting.  She denies any chest pain, shortness of breath, fever, cough, or other upper respiratory symptoms.  States she is felt fine most of the day today.  She has had a few episodes of "belching" but this does happen from time to time it is not abnormal for her.  She is not had any abdominal pain.    Past Medical History:  Diagnosis Date  . Dementia (HCC)   . HTN (hypertension)   . Vertigo     There are no active problems to display for this patient.   No past surgical history on file.   OB History   No obstetric history on file.      Home Medications    Prior to Admission medications   Medication Sig Start Date End Date Taking? Authorizing Provider  acetaminophen (TYLENOL) 325 MG tablet Take 650 mg by mouth every 6 (six) hours as needed for mild pain, moderate pain, fever or headache.    [provider]  amLODipine (NORVASC) 10 MG tablet Take 10 mg by mouth daily.    [provider]  Brexpiprazole (REXULTI) 0.5 MG TABS Take 1 mg by mouth at bedtime.    [provider]  cephALEXin (KEFLEX) 500 MG capsule Take 1 capsule  (500 mg total) by mouth 3 (three) times daily. 06/02/18   Elpidio AnisUpstill, Shari, PA-C  donepezil (ARICEPT) 5 MG tablet Take 5 mg by mouth at bedtime.    [provider]  furosemide (LASIX) 20 MG tablet Take 20 mg by mouth daily.    [provider]  meclizine (ANTIVERT) 25 MG tablet Take 25 mg by mouth 2 (two) times daily.    [provider]  pregabalin (LYRICA) 50 MG capsule Take 50 mg by mouth at bedtime.    [provider]  valACYclovir (VALTREX) 500 MG tablet Take 500 mg by mouth 2 (two) times daily.    [provider]    Family History No family history on file.  Social History Social History   Tobacco Use  . Smoking status: Unknown If Ever Smoked  Substance Use Topics  . Alcohol use: Not on file  . Drug use: Not on file     Allergies   Patient has no known allergies.   Review of Systems Review of Systems  Unable to perform ROS: Dementia     Physical Exam Updated Vital Signs BP 122/72   Pulse (!) 105   Temp 98.5 F (36.9 C)   Resp 18   SpO2 97%   Physical Exam Vitals signs and nursing note reviewed.  Constitutional:  Appearance: She is well-developed.  HENT:     Head: Normocephalic and atraumatic.     Comments: No visible signs of head trauma Eyes:     Conjunctiva/sclera: Conjunctivae normal.     Pupils: Pupils are equal, round, and reactive to light.  Neck:     Musculoskeletal: Normal range of motion.  Cardiovascular:     Rate and Rhythm: Normal rate and regular rhythm.     Heart sounds: Normal heart sounds.  Pulmonary:     Effort: Pulmonary effort is normal.     Breath sounds: Normal breath sounds.  Abdominal:     General: Bowel sounds are normal.     Palpations: Abdomen is soft.  Musculoskeletal: Normal range of motion.  Skin:    General: Skin is warm and dry.  Neurological:     Mental Status: She is alert and oriented to person, place, and time.     Comments: AAOx3 currently, answering questions and  following commands appropriately, moving extremities well      ED Treatments / Results  Labs (all labs ordered are listed, but only abnormal results are displayed) Labs Reviewed  CBC WITH DIFFERENTIAL/PLATELET - Abnormal; Notable for the following components:      Result Value   RBC 3.77 (*)    Hemoglobin 11.7 (*)    All other components within normal limits  BASIC METABOLIC PANEL - Abnormal; Notable for the following components:   Potassium 3.3 (*)    CO2 21 (*)    Glucose, Bld 113 (*)    Creatinine, Ser 1.03 (*)    GFR calc non Af Amer 51 (*)    GFR calc Af Amer 59 (*)    All other components within normal limits  TROPONIN I - Abnormal; Notable for the following components:   Troponin I 0.22 (*)    All other components within normal limits  URINE CULTURE  URINALYSIS, ROUTINE W REFLEX MICROSCOPIC    EKG EKG Interpretation  Date/Time:  Monday Jun 24 2018 21:57:40 EDT Ventricular Rate:  107 PR Interval:    QRS Duration: 81 QT Interval:  325 QTC Calculation: 434 R Axis:   82 Text Interpretation:  Sinus tachycardia Probable left atrial enlargement Borderline right axis deviation ST changes similar to prior.  No STEMI.  Confirmed by Alona Bene [phone]) on 06/24/2018 10:51:19 PM   Radiology Dg Chest 2 View  Result Date: 06/24/2018 CLINICAL DATA:  Recent syncopal episode EXAM: CHEST - 2 VIEW COMPARISON:  None. FINDINGS: The heart size and mediastinal contours are within normal limits. Both lungs are clear. The visualized skeletal structures are unremarkable. IMPRESSION: No active cardiopulmonary disease. Electronically Signed   By: Alcide Clever M.D.   On: 06/24/2018 22:46   Ct Head Wo Contrast  Result Date: 06/24/2018 CLINICAL DATA:  Dizziness, syncope EXAM: CT HEAD WITHOUT CONTRAST TECHNIQUE: Contiguous axial images were obtained from the base of the skull through the vertex without intravenous contrast. COMPARISON:  06/09/2018 FINDINGS: Brain: There is atrophy and chronic  small vessel disease changes. No acute intracranial abnormality. Specifically, no hemorrhage, hydrocephalus, mass lesion, acute infarction, or significant intracranial injury. Vascular: No hyperdense vessel or unexpected calcification. Skull: No acute calvarial abnormality. Sinuses/Orbits: Visualized paranasal sinuses and mastoids clear. Orbital soft tissues unremarkable. Other: None IMPRESSION: Atrophy, chronic microvascular disease. No acute intracranial abnormality. Electronically Signed   By: Charlett Nose M.D.   On: 06/24/2018 23:19    Procedures Procedures (including critical care time)  CRITICAL CARE Performed by: Garlon Hatchet  Total critical care time: 35 minutes  Critical care time was exclusive of separately billable procedures and treating other patients.  Critical care was necessary to treat or prevent imminent or life-threatening deterioration.  Critical care was time spent personally by me on the following activities: development of treatment plan with patient and/or surrogate as well as nursing, discussions with consultants, evaluation of patient's response to treatment, examination of patient, obtaining history from patient or surrogate, ordering and performing treatments and interventions, ordering and review of laboratory studies, ordering and review of radiographic studies, pulse oximetry and re-evaluation of patient's condition.;  Medications Ordered in ED Medications  aspirin chewable tablet 324 mg (has no administration in time range)     Initial Impression / Assessment and Plan / ED Course  I have reviewed the triage vital signs and the nursing notes.  Pertinent labs & imaging results that were available during my care of the patient were reviewed by me and considered in my medical decision making (see chart for details).  83 year old female here after syncopal event.  She was being transferred from her wheelchair to her bed when she had a syncopal event.  Staff  was able to lower her to the ground.  There was no direct head trauma.  States she does not remember passing out but does remember having some dizziness prior to syncope, she describes this as room spinning.  She does have known history of vertigo.  Currently she feels fine with no complaints.  She has not had any chest pain or shortness of breath.  No cough, fever, or known COVID exposures.  She is awake, alert, appropriately oriented here.  She is following commands and answering questions appropriately.  She has no complaints currently.  Will obtain screening labs, chest x-ray, and CT head.  EKG here is nonischemic.  11:40 PM Patient reassessed-- vitals remain stable.  Continues to deny and chest pain or SOB.  Labs here with positive troponin of 0.22.   Chest x-ray and CT head are reassuring.  She does not have any history of elevated troponin in the past.  Given her concurrent syncope, will admit for observation and cardiac rule out. Given full dose ASA.    Discussed with Dr. Antionette Char-- will admit for ongoing care.  Final Clinical Impressions(s) / ED Diagnoses   Final diagnoses:  Syncope and collapse  Elevated troponin    ED Discharge Orders    None       Garlon Hatchet, PA-C 06/24/18 2358    Maia Plan, MD 06/26/18 1313

## 2018-06-24 NOTE — ED Notes (Signed)
Bed: ML54 Expected date:  Expected time:  Means of arrival:  Comments: EMS 83 yo female from [facility]-syncopal episode ST

## 2018-06-24 NOTE — H&P (Signed)
History and Physical    Taylor Buttneratricia G. Ruiz ZOX:[id number] DOB: 05/13/1935 DOA: 06/24/2018  PCP: Taylor Ruiz, Roy, MD   Patient coming from: SNF   Chief Complaint: Syncope   HPI: Taylor Buttneratricia G. Taylor Ruiz is a 83 y.o. female with medical history significant for hypertension, vertigo, and dementia, now presenting from her nursing facility after a syncopal episode.  Patient seem to be having an uneventful day and was being transferred from her wheelchair to her bed to sleep, but suffered a brief loss of consciousness during this.  Patient reported feeling lightheaded just prior to the episode, but she denied any chest pain, cough, or shortness of breath in the ED.  She reports feeling back to normal in the emergency department but becomes lightheaded if she sits up or tries to stand.  ED Course: Upon arrival to the ED, patient is found to be afebrile, saturating well on room air, slightly tachycardic, and with stable blood pressure.  She had a 32 mmHg drop in systolic blood pressure upon standing.  EKG features a sinus rhythm with nonspecific ST abnormality that appears similar to prior.  Chest x-ray is negative for acute cardiopulmonary disease and noncontrast head CT is negative for acute intracranial abnormality.  Chemistry panel features a creatinine 1.03 and potassium of 3.3.  CBC is unremarkable. Troponin is elevated to 0.22.  Patient was given 324 mg of aspirin in the ED.  She will be observed for ongoing evaluation and management.  Review of Systems:  All other systems reviewed and apart from HPI, are negative.  Past Medical History:  Diagnosis Date  . Dementia (HCC)   . HTN (hypertension)   . Vertigo     History reviewed. No pertinent surgical history.   has no history on file for tobacco, alcohol, and drug.  No Known Allergies  History reviewed. No pertinent family history.   Prior to Admission medications   Medication Sig Start Date End Date Taking? Authorizing Provider  acetaminophen  (TYLENOL) 325 MG tablet Take 650 mg by mouth every 6 (six) hours as needed for mild pain, moderate pain, fever or headache.    [provider]  amLODipine (NORVASC) 10 MG tablet Take 10 mg by mouth daily.    [provider]  Brexpiprazole (REXULTI) 0.5 MG TABS Take 1 mg by mouth at bedtime.    [provider]  cephALEXin (KEFLEX) 500 MG capsule Take 1 capsule (500 mg total) by mouth 3 (three) times daily. 06/02/18   Taylor Ruiz, Shari, PA-C  donepezil (ARICEPT) 5 MG tablet Take 5 mg by mouth at bedtime.    [provider]  furosemide (LASIX) 20 MG tablet Take 20 mg by mouth daily.    [provider]  meclizine (ANTIVERT) 25 MG tablet Take 25 mg by mouth 2 (two) times daily.    [provider]  pregabalin (LYRICA) 50 MG capsule Take 50 mg by mouth at bedtime.    [provider]  valACYclovir (VALTREX) 500 MG tablet Take 500 mg by mouth 2 (two) times daily.    [provider]    Physical Exam: Vitals:   06/24/18 2151 06/24/18 2153 06/24/18 2159  BP:   122/72  Pulse:  (!) 105   Resp:   18  Temp:  98.5 F (36.9 C)   SpO2: 98%  97%    Constitutional: NAD, calm  Eyes: PERTLA, lids and conjunctivae normal ENMT: Mucous membranes are moist. Posterior pharynx clear of any exudate or lesions.   Neck: normal, supple,  no masses, no thyromegaly Respiratory: clear to auscultation bilaterally, no wheezing, no crackles.  No accessory muscle use.  Cardiovascular: S1 & S2 heard, regular rate and rhythm. 1+ pretibial pitting edema bilaterally. Abdomen: No distension, no tenderness, soft. Bowel sounds active.  Musculoskeletal: no clubbing / cyanosis. No joint deformity upper and lower extremities. Normal muscle tone.  Skin: no significant rashes, lesions, ulcers. Warm, dry, well-perfused. Neurologic: CN 2-12 grossly intact. Sensation intact. Moving all extremities.  Psychiatric: Alert and oriented to person, place, and situation. Pleasant,  cooperative.    Labs on Admission: I have personally reviewed following labs and imaging studies  CBC: Recent Labs  Lab 06/24/18 2221  WBC 8.5  NEUTROABS 6.9  HGB 11.7*  HCT 36.0  MCV 95.5  PLT 189   Basic Metabolic Panel: Recent Labs  Lab 06/24/18 2221  NA 137  K 3.3*  CL 107  CO2 21*  GLUCOSE 113*  BUN 14  CREATININE 1.03*  CALCIUM 9.2   GFR: CrCl cannot be calculated (Unknown ideal weight.). Liver Function Tests: No results for input(s): AST, ALT, ALKPHOS, BILITOT, PROT, ALBUMIN in the last 168 hours. No results for input(s): LIPASE, AMYLASE in the last 168 hours. No results for input(s): AMMONIA in the last 168 hours. Coagulation Profile: No results for input(s): INR, PROTIME in the last 168 hours. Cardiac Enzymes: Recent Labs  Lab 06/24/18 2221  TROPONINI 0.22*   BNP (last 3 results) No results for input(s): PROBNP in the last 8760 hours. HbA1C: No results for input(s): HGBA1C in the last 72 hours. CBG: No results for input(s): GLUCAP in the last 168 hours. Lipid Profile: No results for input(s): CHOL, HDL, LDLCALC, TRIG, CHOLHDL, LDLDIRECT in the last 72 hours. Thyroid Function Tests: No results for input(s): TSH, T4TOTAL, FREET4, T3FREE, THYROIDAB in the last 72 hours. Anemia Panel: No results for input(s): VITAMINB12, FOLATE, FERRITIN, TIBC, IRON, RETICCTPCT in the last 72 hours. Urine analysis:    Component Value Date/Time   COLORURINE YELLOW 06/09/2018 1514   APPEARANCEUR HAZY (A) 06/09/2018 1514   LABSPEC 1.018 06/09/2018 1514   PHURINE 6.0 06/09/2018 1514   GLUCOSEU NEGATIVE 06/09/2018 1514   HGBUR NEGATIVE 06/09/2018 1514   BILIRUBINUR NEGATIVE 06/09/2018 1514   KETONESUR 5 (A) 06/09/2018 1514   PROTEINUR NEGATIVE 06/09/2018 1514   NITRITE NEGATIVE 06/09/2018 1514   LEUKOCYTESUR NEGATIVE 06/09/2018 1514   Sepsis Labs: (procalcitonin:4,lacticidven:4) )No results found for this or any previous visit (from the past 240  hour(s)).   Radiological Exams on Admission: Dg Chest 2 View  Result Date: 06/24/2018 CLINICAL DATA:  Recent syncopal episode EXAM: CHEST - 2 VIEW COMPARISON:  None. FINDINGS: The heart size and mediastinal contours are within normal limits. Both lungs are clear. The visualized skeletal structures are unremarkable. IMPRESSION: No active cardiopulmonary disease. Electronically Signed   By: Alcide Clever M.D.   On: 06/24/2018 22:46   Ct Head Wo Contrast  Result Date: 06/24/2018 CLINICAL DATA:  Dizziness, syncope EXAM: CT HEAD WITHOUT CONTRAST TECHNIQUE: Contiguous axial images were obtained from the base of the skull through the vertex without intravenous contrast. COMPARISON:  06/09/2018 FINDINGS: Brain: There is atrophy and chronic small vessel disease changes. No acute intracranial abnormality. Specifically, no hemorrhage, hydrocephalus, mass lesion, acute infarction, or significant intracranial injury. Vascular: No hyperdense vessel or unexpected calcification. Skull: No acute calvarial abnormality. Sinuses/Orbits: Visualized paranasal sinuses and mastoids clear. Orbital soft tissues unremarkable. Other: None IMPRESSION: Atrophy, chronic microvascular disease. No acute intracranial abnormality. Electronically Signed   By:  Charlett Nose M.D.   On: 06/24/2018 23:19    EKG: Independently reviewed. Sinus rhythm, no acute ischemic features.   Assessment/Plan   1. Syncope  - Presents following a syncopal episode  - She is found to be in sinus rhythm with orthostatic hypotension and non-specific elevation in troponin  - Likely secondary to orhtostasis; elevated troponin noted but no chest pain or EKG change to suggest ACS and no hypoxia, SOB, or cough concerning for PE  - Continue cardiac monitoring, hold antihypertensives and diuretics, gentle IVF hydration, check echocardiogram    2. Elevated troponin  - Troponin is 0.22 in ED without chest pain or SOB  - EKG without acute ischemic features and CXR  is unremarkable   - Continue cardiac monitoring, trend troponin, repeat EKG, check echocardiogram    3. Hypertension  - She has normal BP while laying in ED, becomes hypotensive with standing - Hold antihypertensives and diuretic initially   4. Hypokalemia  - Add KCl to IVF, repeat chem panel in am     PPE: Mask, face shield  DVT prophylaxis: Lovenox  Code Status: Full  Family Communication: Discussed with patient  Consults called: None Admission status: Observation     Briscoe Deutscher, MD [facility] Pager [phone]  If 7PM-7AM, please contact night-coverage [email or web address] Password [email or web address]  06/24/2018, 11:57 PM

## 2018-06-24 NOTE — ED Triage Notes (Signed)
Per EMS, Pt is arriving from [facility]. Employees were moving pt from wheelchair to bed and pt had syncopal episode. Initial pressure was 212/82, no signs of a stroke, recent fall apprx 2 wks ago. No htx of dementia, but at baseline according to staff. ST on monitor.

## 2018-06-24 NOTE — ED Notes (Signed)
Date and time results received: 06/24/18 2328 (use smartphrase ".now" to insert current time)  Test: Troponin Critical Value: 0.22  Name of Provider Notified: Monia Sabal. PA  Orders Received? Or Actions Taken?:

## 2018-06-24 NOTE — ED Notes (Signed)
Pt unable to continue standing for final orthostatic pressure.

## 2018-06-25 ENCOUNTER — Observation Stay (HOSPITAL_BASED_OUTPATIENT_CLINIC_OR_DEPARTMENT_OTHER): Payer: Medicare Other

## 2018-06-25 ENCOUNTER — Encounter (HOSPITAL_COMMUNITY): Payer: Self-pay

## 2018-06-25 DIAGNOSIS — I951 Orthostatic hypotension: Secondary | ICD-10-CM

## 2018-06-25 DIAGNOSIS — R7989 Other specified abnormal findings of blood chemistry: Secondary | ICD-10-CM | POA: Diagnosis not present

## 2018-06-25 DIAGNOSIS — I10 Essential (primary) hypertension: Secondary | ICD-10-CM

## 2018-06-25 DIAGNOSIS — R55 Syncope and collapse: Secondary | ICD-10-CM

## 2018-06-25 DIAGNOSIS — F0390 Unspecified dementia without behavioral disturbance: Secondary | ICD-10-CM | POA: Diagnosis not present

## 2018-06-25 DIAGNOSIS — G300 Alzheimer's disease with early onset: Secondary | ICD-10-CM | POA: Diagnosis not present

## 2018-06-25 DIAGNOSIS — Z1159 Encounter for screening for other viral diseases: Secondary | ICD-10-CM | POA: Diagnosis not present

## 2018-06-25 DIAGNOSIS — E876 Hypokalemia: Secondary | ICD-10-CM | POA: Diagnosis not present

## 2018-06-25 DIAGNOSIS — F0280 Dementia in other diseases classified elsewhere without behavioral disturbance: Secondary | ICD-10-CM

## 2018-06-25 LAB — URINALYSIS, ROUTINE W REFLEX MICROSCOPIC
Bilirubin Urine: NEGATIVE
Glucose, UA: NEGATIVE mg/dL
Ketones, ur: NEGATIVE mg/dL
Nitrite: NEGATIVE
Protein, ur: NEGATIVE mg/dL
Specific Gravity, Urine: 1.008 (ref 1.005–1.030)
pH: 6 (ref 5.0–8.0)

## 2018-06-25 LAB — CBC
HCT: 32.8 % — ABNORMAL LOW (ref 36.0–46.0)
Hemoglobin: 10.6 g/dL — ABNORMAL LOW (ref 12.0–15.0)
MCH: 31.6 pg (ref 26.0–34.0)
MCHC: 32.3 g/dL (ref 30.0–36.0)
MCV: 97.9 fL (ref 80.0–100.0)
Platelets: 177 10*3/uL (ref 150–400)
RBC: 3.35 MIL/uL — ABNORMAL LOW (ref 3.87–5.11)
RDW: 14.2 % (ref 11.5–15.5)
WBC: 8.3 10*3/uL (ref 4.0–10.5)
nRBC: 0 % (ref 0.0–0.2)

## 2018-06-25 LAB — ECHOCARDIOGRAM COMPLETE
Height: 67 in
Weight: 2599.66 oz

## 2018-06-25 LAB — BASIC METABOLIC PANEL
Anion gap: 7 (ref 5–15)
BUN: 12 mg/dL (ref 8–23)
CO2: 21 mmol/L — ABNORMAL LOW (ref 22–32)
Calcium: 8.8 mg/dL — ABNORMAL LOW (ref 8.9–10.3)
Chloride: 112 mmol/L — ABNORMAL HIGH (ref 98–111)
Creatinine, Ser: 0.88 mg/dL (ref 0.44–1.00)
GFR calc Af Amer: >60 mL/min (ref >60)
GFR calc non Af Amer: >60 mL/min (ref >60)
Glucose, Bld: 110 mg/dL — ABNORMAL HIGH (ref 70–99)
Potassium: 3.6 mmol/L (ref 3.5–5.1)
Sodium: 140 mmol/L (ref 135–145)

## 2018-06-25 LAB — SARS CORONAVIRUS 2 BY RT PCR (HOSPITAL ORDER, PERFORMED IN ~~LOC~~ HOSPITAL LAB): SARS Coronavirus 2: NEGATIVE

## 2018-06-25 LAB — MAGNESIUM: Magnesium: 1.8 mg/dL (ref 1.7–2.4)

## 2018-06-25 LAB — TROPONIN I
Troponin I: 0.5 ng/mL (ref <0.03)
Troponin I: 0.34 ng/mL (ref <0.03)
Troponin I: 0.22 ng/mL (ref <0.03)

## 2018-06-25 LAB — GLUCOSE, CAPILLARY: Glucose-Capillary: 99 mg/dL (ref 70–99)

## 2018-06-25 MED ORDER — PERFLUTREN LIPID MICROSPHERE
1.0000 mL | INTRAVENOUS | Status: AC | PRN
  Administered 2018-06-25: 3 mL via INTRAVENOUS
  Filled 2018-06-25: qty 10

## 2018-06-25 MED ORDER — METOPROLOL TARTRATE 25 MG PO TABS: 25.0000 mg | ORAL_TABLET | Freq: Two times a day (BID) | ORAL | Status: DC

## 2018-06-25 MED ORDER — METOPROLOL TARTRATE 25 MG PO TABS
25.0000 mg | ORAL_TABLET | Freq: Two times a day (BID) | ORAL | Status: DC
  Administered 2018-06-25: 25 mg via ORAL
  Filled 2018-06-25: qty 1

## 2018-06-25 MED ORDER — AMLODIPINE BESYLATE 5 MG PO TABS: 2.5000 mg | ORAL_TABLET | Freq: Every day | ORAL | Status: DC

## 2018-06-25 MED ORDER — AMLODIPINE BESYLATE 2.5 MG PO TABS: 10.0000 mg | ORAL_TABLET | Freq: Every day | ORAL | Status: DC

## 2018-06-25 NOTE — Progress Notes (Signed)
Pt returning to [facility] assisted living today. Sent facility pt's discharge information and COVID screening requirements (questionnare about symptoms/contact; negative lab result). Facility reviewing and CSW will arrange transportation once they are prepared for pt's return. RN has given report to facility. (NO FL2 required, pt in observation <24 hours)  Ilean Skill, MSW, LCSW Clinical Social Work 06/25/2018 [phone]

## 2018-06-25 NOTE — ED Notes (Signed)
ED TO INPATIENT HANDOFF REPORT  ED Nurse Name and Phone #: Berdine Dance  S Name/Age/Gender Taylor Ruiz 83 y.o. female Room/Bed: WA19/WA19  Code Status   Code Status: Full Code  Home/SNF/Other Given to floor Patient oriented to: self Is this baseline? Yes   Triage Complete: Triage complete  Chief Complaint Syncope Episode  Triage Note Per EMS, Pt is arriving from [facility]. Employees were moving pt from wheelchair to bed and pt had syncopal episode. Initial pressure was 212/82, no signs of a stroke, recent fall apprx 2 wks ago. No htx of dementia, but at baseline according to staff. ST on monitor.    Allergies No Known Allergies  Level of Care/Admitting Diagnosis ED Disposition    ED Disposition Condition Comment   Admit  Hospital Area: [facility] [[id number]]  Level of Care: Telemetry [5]  Admit to tele based on following criteria: Eval of Syncope  Covid Evaluation: Screening Protocol (No Symptoms)  Diagnosis: Syncope [206001]  Admitting Physician: Briscoe Deutscher [[id number]]  Attending Physician: Briscoe Deutscher [[id number]]  PT Class (Do Not Modify): Observation [104]  PT Acc Code (Do Not Modify): Observation [10022]       B Medical/Surgery History Past Medical History:  Diagnosis Date  . Dementia (HCC)   . HTN (hypertension)   . Vertigo    History reviewed. No pertinent surgical history.   A IV Location/Drains/Wounds Patient Lines/Drains/Airways Status   Active Line/Drains/Airways    Name:   Placement date:   Placement time:   Site:   Days:   Peripheral IV 06/24/18 Left Hand   06/24/18    2148    Hand   1          Intake/Output Last 24 hours No intake or output data in the 24 hours ending 06/25/18 0105  Labs/Imaging Results for orders placed or performed during the hospital encounter of 06/24/18 (from the past 48 hour(s))  CBC with Differential     Status: Abnormal   Collection Time: 06/24/18 10:21 PM   Result Value Ref Range   WBC 8.5 4.0 - 10.5 K/uL   RBC 3.77 (L) 3.87 - 5.11 MIL/uL   Hemoglobin 11.7 (L) 12.0 - 15.0 g/dL   HCT 62.1 30.8 - 65.7 %   MCV 95.5 80.0 - 100.0 fL   MCH 31.0 26.0 - 34.0 pg   MCHC 32.5 30.0 - 36.0 g/dL   RDW 84.6 96.2 - 95.2 %   Platelets 189 150 - 400 K/uL   nRBC 0.0 0.0 - 0.2 %   Neutrophils Relative % 82 %   Neutro Abs 6.9 1.7 - 7.7 K/uL   Lymphocytes Relative 11 %   Lymphs Abs 0.9 0.7 - 4.0 K/uL   Monocytes Relative 6 %   Monocytes Absolute 0.5 0.1 - 1.0 K/uL   Eosinophils Relative 1 %   Eosinophils Absolute 0.0 0.0 - 0.5 K/uL   Basophils Relative 0 %   Basophils Absolute 0.0 0.0 - 0.1 K/uL   Immature Granulocytes 0 %   Abs Immature Granulocytes 0.03 0.00 - 0.07 K/uL    Comment: Performed at [facility], [address] [place], [place] [address]  Basic metabolic panel     Status: Abnormal   Collection Time: 06/24/18 10:21 PM  Result Value Ref Range   Sodium 137 135 - 145 mmol/L   Potassium 3.3 (L) 3.5 - 5.1 mmol/L   Chloride 107 98 - 111 mmol/L   CO2 21 (L)  22 - 32 mmol/L   Glucose, Bld 113 (H) 70 - 99 mg/dL   BUN 14 8 - 23 mg/dL   Creatinine, Ser 1.22 (H) 0.44 - 1.00 mg/dL   Calcium 9.2 8.9 - 48.2 mg/dL   GFR calc non Af Amer 51 (L) >60 mL/min   GFR calc Af Amer 59 (L) >60 mL/min   Anion gap 9 5 - 15    Comment: Performed at [facility], [address] [place], [place] [address]  Troponin I - ONCE - STAT     Status: Abnormal   Collection Time: 06/24/18 10:21 PM  Result Value Ref Range   Troponin I 0.22 (HH) <0.03 ng/mL    Comment: CRITICAL RESULT CALLED TO, READ BACK BY AND VERIFIED WITH: RN Salena Saner HODGES AT 2326 06/24/18 CRUICKSHANK A Performed at [facility], [address] [place], [place] [address]    Dg Chest 2 View  Result Date: 06/24/2018 CLINICAL DATA:  Recent syncopal episode EXAM: CHEST - 2 VIEW COMPARISON:  None. FINDINGS: The heart size and mediastinal contours are  within normal limits. Both lungs are clear. The visualized skeletal structures are unremarkable. IMPRESSION: No active cardiopulmonary disease. Electronically Signed   By: Alcide Clever M.D.   On: 06/24/2018 22:46   Ct Head Wo Contrast  Result Date: 06/24/2018 CLINICAL DATA:  Dizziness, syncope EXAM: CT HEAD WITHOUT CONTRAST TECHNIQUE: Contiguous axial images were obtained from the base of the skull through the vertex without intravenous contrast. COMPARISON:  06/09/2018 FINDINGS: Brain: There is atrophy and chronic small vessel disease changes. No acute intracranial abnormality. Specifically, no hemorrhage, hydrocephalus, mass lesion, acute infarction, or significant intracranial injury. Vascular: No hyperdense vessel or unexpected calcification. Skull: No acute calvarial abnormality. Sinuses/Orbits: Visualized paranasal sinuses and mastoids clear. Orbital soft tissues unremarkable. Other: None IMPRESSION: Atrophy, chronic microvascular disease. No acute intracranial abnormality. Electronically Signed   By: Charlett Nose M.D.   On: 06/24/2018 23:19    Pending Labs Unresulted Labs (From admission, onward)    Start     Ordered   07/01/18 0500  Creatinine, serum  (enoxaparin (LOVENOX)    CrCl >/= 30 ml/min)  Weekly,   R    Comments:  while on enoxaparin therapy    06/24/18 2357   06/25/18 0500  Basic metabolic panel  Tomorrow morning,   R     06/24/18 2357   06/25/18 0500  Magnesium  Tomorrow morning,   R     06/24/18 2357   06/25/18 0500  CBC  Tomorrow morning,   R     06/24/18 2357   06/25/18 0400  Troponin I - Now Then Q6H  Now then every 6 hours,   R     06/24/18 2357   06/24/18 2359  SARS Coronavirus 2 (CEPHEID - Performed in [facility] hospital lab), Hosp Order  (Asymptomatic Patients Labs)  Once,   R    Question:  Rule Out  Answer:  Yes   06/24/18 2359   06/24/18 2224  Urine culture  ONCE - STAT,   STAT     06/24/18 2223   06/24/18 2221  Urinalysis, Routine w reflex microscopic  Once,    R     06/24/18 2223          Vitals/Pain Today's Vitals   06/24/18 2151 06/24/18 2153 06/24/18 2159 06/25/18 0042  BP:   122/72 (!) 95/56  Pulse:  (!) 105  81  Resp:   18 18  Temp:  98.5 F (36.9 C)    SpO2: 98%  97% 98%  PainSc:   0-No pain     Isolation Precautions No active isolations  Medications Medications  sodium chloride flush (NS) 0.9 % injection 3 mL (has no administration in time range)  enoxaparin (LOVENOX) injection 40 mg (has no administration in time range)  sodium chloride flush (NS) 0.9 % injection 3 mL (has no administration in time range)  sodium chloride flush (NS) 0.9 % injection 3 mL (has no administration in time range)  0.9 %  sodium chloride infusion (has no administration in time range)  acetaminophen (TYLENOL) tablet 650 mg (has no administration in time range)    Or  acetaminophen (TYLENOL) suppository 650 mg (has no administration in time range)  ondansetron (ZOFRAN) tablet 4 mg (has no administration in time range)    Or  ondansetron (ZOFRAN) injection 4 mg (has no administration in time range)  sodium chloride 0.9 % bolus 250 mL (has no administration in time range)  0.9 % NaCl with KCl 20 mEq/ L  infusion (has no administration in time range)  potassium chloride SA (K-DUR) CR tablet 20 mEq (has no administration in time range)  aspirin chewable tablet 324 mg (324 mg Oral Given 06/25/18 0005)    Mobility manual wheelchair High fall risk   Focused Assessments Cardiac Assessment Handoff:  Cardiac Rhythm: Sinus tachycardia Lab Results  Component Value Date   TROPONINI 0.22 (HH) 06/24/2018   No results found for: DDIMER Does the Patient currently have chest pain? No     R Recommendations: See Admitting Provider Note  Report given to:   Additional Notes:

## 2018-06-25 NOTE — Consult Note (Addendum)
Cardiology Consultation:   Patient ID: Taylor Ruiz MRN: [id number]; DOB: [birth year]  Admit date: 06/24/2018 Date of Consult: 06/25/2018  Primary Care Provider: Ralene Ok, MD Primary Cardiologist: No primary care provider on file.  Primary Electrophysiologist:  None    Patient Profile:   Taylor Ruiz is a 83 y.o. female with a hx of HTN, vertigo, and dementia who is being seen today for the evaluation of syncope at the request of Dr. Caleb Popp.  History of Present Illness:   Taylor Ruiz is a 83 yo female with PMH noted above. Does not appear that she has ever been seen by cardiology in the past. Currently resided at [facility] and requires assistance with her ADLs. According to notes, staff reported she had a recent fall 2 weeks prior but no injury. Has hx of dementia but reported to be at her baseline. Was attempting to transfer from wheelchair to bed when she developed lightheadedness. Had a brief witnessed syncopal episode. Denied any chest pain, palpitations, shortness of breath with this event. Transferred to the ED for further work up.   On arrival to the ED she reported feeling well and back to baseline, but did become dizzy if she attempted to sit up or stand. Labs showed stable electrolytes with execption of K+ 3.3, Cr 1.03. CBC unremarkable. Troponin noted 0.22>>0.5>>0.34. CXR neg, and CT head negative. Was noted to be orthostatic with a drop in her systolic BP with attempts to stand. EKG on admission showed mild ST with no acute ischemia noted. She was admitted for observation and IV hydration. BP medications held on admission.   Past Medical History:  Diagnosis Date  . Dementia (HCC)   . HTN (hypertension)   . Vertigo     History reviewed. No pertinent surgical history.   Home Medications:  Prior to Admission medications   Medication Sig Start Date End Date Taking? Authorizing Provider  acetaminophen (TYLENOL) 325 MG tablet Take 650 mg by mouth every 6 (six)  hours as needed for mild pain, moderate pain, fever or headache.   Yes [provider]  amLODipine (NORVASC) 10 MG tablet Take 10 mg by mouth daily.   Yes [provider]  Brexpiprazole (REXULTI) 0.5 MG TABS Take 1 mg by mouth at bedtime.   Yes [provider]  donepezil (ARICEPT) 5 MG tablet Take 5 mg by mouth at bedtime.   Yes [provider]  furosemide (LASIX) 20 MG tablet Take 20 mg by mouth daily.   Yes [provider]  meclizine (ANTIVERT) 25 MG tablet Take 25 mg by mouth 2 (two) times daily.   Yes [provider]  pregabalin (LYRICA) 50 MG capsule Take 50 mg by mouth at bedtime.   Yes [provider]  valACYclovir (VALTREX) 500 MG tablet Take 500 mg by mouth 2 (two) times daily.   Yes [provider]  cephALEXin (KEFLEX) 500 MG capsule Take 1 capsule (500 mg total) by mouth 3 (three) times daily. Patient not taking: Reported on 06/25/2018 06/02/18   Elpidio Anis, PA-C   Inpatient Medications: Scheduled Meds: . enoxaparin (LOVENOX) injection  40 mg Subcutaneous Q24H  . sodium chloride flush  3 mL Intravenous Q12H  . sodium chloride flush  3 mL Intravenous Q12H   Continuous Infusions: . sodium chloride     PRN Meds: sodium chloride, acetaminophen **OR** acetaminophen, ondansetron **OR** ondansetron (ZOFRAN) IV, sodium chloride flush  Allergies:   No Known Allergies  Social History:   Social History  Socioeconomic History  . Marital status: Widowed    Spouse name: Not on file  . Number of children: Not on file  . Years of education: Not on file  . Highest education level: Not on file  Occupational History  . Not on file  Social Needs  . Financial resource strain: Not on file  . Food insecurity:    Worry: Not on file    Inability: Not on file  . Transportation needs:    Medical: Not on file    Non-medical: Not on file  Tobacco Use  . Smoking status: Never Smoker  . Smokeless tobacco: Never Used   . Tobacco comment: unsure if ever smoked or used smokeless tobacco  Substance and Sexual Activity  . Alcohol use: Not on file  . Drug use: Not on file  . Sexual activity: Not on file  Lifestyle  . Physical activity:    Days per week: Not on file    Minutes per session: Not on file  . Stress: Not on file  Relationships  . Social connections:    Talks on phone: Not on file    Gets together: Not on file    Attends religious service: Not on file    Active member of club or organization: Not on file    Attends meetings of clubs or organizations: Not on file    Relationship status: Not on file  . Intimate partner violence:    Fear of current or ex partner: Not on file    Emotionally abused: Not on file    Physically abused: Not on file    Forced sexual activity: Not on file  Other Topics Concern  . Not on file  Social History Narrative  . Not on file    Family History:   No FH of early CAD or SCD.  ROS:  Please see the history of present illness.   All other ROS reviewed and negative.     Physical Exam/Data:   Vitals:   06/25/18 0539 06/25/18 1046 06/25/18 1047 06/25/18 1051  BP: 138/76 131/68 134/62 (!) 188/140  Pulse: 92 86 92 96  Resp:      Temp:      TempSrc:      SpO2:  97% 97%   Weight:      Height:        Intake/Output Summary (Last 24 hours) at 06/25/2018 1118 Last data filed at 06/25/2018 1032 Gross per 24 hour  Intake 948.11 ml  Output 650 ml  Net 298.11 ml   Last 3 Weights 06/25/2018  Weight (lbs) 162 lb 7.7 oz  Weight (kg) 73.7 kg     Body mass index is 25.45 kg/m.   Physical Exam per MD. Alert oriented x2 General:  Well nourished, well developed, in no acute distress HEENT: normal Lymph: no adenopathy Neck: no JVD Endocrine:  No thryomegaly Vascular: No carotid bruits; FA pulses 2+ bilaterally without bruits  Cardiac:  normal S1, S2; RRR; no murmur  Lungs:  clear to auscultation bilaterally, no wheezing, rhonchi or rales  Abd: soft,  nontender, no hepatomegaly  Ext: no edema Musculoskeletal:  No deformities, BUE and BLE strength normal and equal Skin: warm and dry  Neuro:  CNs 2-12 intact, no focal abnormalities noted Psych:  Normal affect   EKG:  The EKG was personally reviewed and demonstrates:  SR Telemetry:  Telemetry was personally reviewed and demonstrates: Sinus rhythm  Relevant CV Studies:  TTE: 06/25/18  Pending read  Laboratory  Data:  Chemistry Recent Labs  Lab 06/24/18 2221 06/25/18 0407  NA 137 140  K 3.3* 3.6  CL 107 112*  CO2 21* 21*  GLUCOSE 113* 110*  BUN 14 12  CREATININE 1.03* 0.88  CALCIUM 9.2 8.8*  GFRNONAA 51* >60  GFRAA 59* >60  ANIONGAP 9 7    No results for input(s): PROT, ALBUMIN, AST, ALT, ALKPHOS, BILITOT in the last 168 hours. Hematology Recent Labs  Lab 06/24/18 2221 06/25/18 0407  WBC 8.5 8.3  RBC 3.77* 3.35*  HGB 11.7* 10.6*  HCT 36.0 32.8*  MCV 95.5 97.9  MCH 31.0 31.6  MCHC 32.5 32.3  RDW 14.3 14.2  PLT 189 177   Cardiac Enzymes Recent Labs  Lab 06/24/18 2221 06/25/18 0407 06/25/18 0955  TROPONINI 0.22* 0.50* 0.34*   No results for input(s): TROPIPOC in the last 168 hours.  BNPNo results for input(s): BNP, PROBNP in the last 168 hours.  DDimer No results for input(s): DDIMER in the last 168 hours.  Radiology/Studies:  Dg Chest 2 View  Result Date: 06/24/2018 CLINICAL DATA:  Recent syncopal episode EXAM: CHEST - 2 VIEW COMPARISON:  None. FINDINGS: The heart size and mediastinal contours are within normal limits. Both lungs are clear. The visualized skeletal structures are unremarkable. IMPRESSION: No active cardiopulmonary disease. Electronically Signed   By: Alcide Clever M.D.   On: 06/24/2018 22:46   Ct Head Wo Contrast  Result Date: 06/24/2018 CLINICAL DATA:  Dizziness, syncope EXAM: CT HEAD WITHOUT CONTRAST TECHNIQUE: Contiguous axial images were obtained from the base of the skull through the vertex without intravenous contrast. COMPARISON:   06/09/2018 FINDINGS: Brain: There is atrophy and chronic small vessel disease changes. No acute intracranial abnormality. Specifically, no hemorrhage, hydrocephalus, mass lesion, acute infarction, or significant intracranial injury. Vascular: No hyperdense vessel or unexpected calcification. Skull: No acute calvarial abnormality. Sinuses/Orbits: Visualized paranasal sinuses and mastoids clear. Orbital soft tissues unremarkable. Other: None IMPRESSION: Atrophy, chronic microvascular disease. No acute intracranial abnormality. Electronically Signed   By: Charlett Nose M.D.   On: 06/24/2018 23:19    Assessment and Plan:   Taylor Ruiz is a 83 y.o. female with a hx of HTN, vertigo, and dementia who is being seen today for the evaluation of syncope at the request of Dr. Caleb Popp.  1. Syncope: in the setting of orthostatic hypotension. Noted to have a drop in systolic BP while in the ED. PTA amlodipine and lasix held. Blood pressures stable today, one elevated reading. EKG without arrhythmia noted. Telemetry shows only normal sinus rhythm no pauses or arrhythmias. Her echocardiogram was personally reviewed and shows hyperdynamic LVEF with mild concentric LVH and grade 1 diastolic dysfunction, no valvular abnormalities. I would restart amlodipine at 2.5 mg p.o. daily, start metoprolol 25 mg p.o. twice daily and discontinue Lasix.  2. Elevated troponin: suspect demand ischemia in the setting of low blood pressure and syncope. No reported chest pain.  -- echo shows a hyperdynamic LVEF and no regional wall motion abnormalities.  Given lack of chest pain and advanced dementia patient is not a candidate for ischemic work-up.  3. Vertigo: on antivert PTA  4. Dementia: Aricept currently held per primary  5. Hypokalemia: supplement via primary  [facility] will sign off.   Medication Recommendations: As above Other recommendations (labs, testing, etc): No further testing Follow up as an  outpatient: We will arrange.  For questions or updates, please contact [facility] Please consult [email or web address] for contact info under  Signed, Laverda Page, NP  06/25/2018 11:18 AM

## 2018-06-25 NOTE — Discharge Instructions (Signed)
Recommendations for Outpatient Follow-up:  1. Follow up with PCP in 1 week, repeat BMP (potassium), BP check 2. New medications: Lasix discontinued, continue amlodipine at reduced dose of 2.5 mg, metoprolol tartrate 25 mg twice daily 3. Please take pt's BP at least every 12 hours to monitor with medication changes.

## 2018-06-25 NOTE — Discharge Summary (Signed)
Discharge Summary  Taylor Ruiz UQJ:[id number] DOB: 04-20-1935  PCP: Ralene Ok, MD  Admit date: 06/24/2018 Discharge date: 06/25/2018   Time spent: < 25 minutes  Admitted From: SNF Disposition: SNF  Recommendations for Outpatient Follow-up:  1. Follow up with PCP in 1 week, repeat BMP (potassium), BP check 2. New medications: Lasix discontinued, continue amlodipine at reduced dose of 2.5 mg, metoprolol tartrate 25 mg twice daily     Discharge Diagnoses:  Active Hospital Problems   Diagnosis Date Noted   Syncope 06/24/2018   Dementia (HCC) 06/24/2018   Mild renal insufficiency 06/24/2018   Hypokalemia 06/24/2018   Elevated troponin 06/24/2018    Resolved Hospital Problems  No resolved problems to display.    Discharge Condition: Stable   CODE STATUS:FULL  Diet recommendation:    History of present illness:  Taylor Ruiz is a 83 y.o. year old female with medical history significant for HTN, dementia, vertigo who presented on 06/24/2018 from [facility] nursing facility with reported syncopal episode while staff moved patient from wheelchair to bed with no head trauma.  Blood pressure at that time was 212/82.  Patient recalls feeling lightheaded prior to her brief loss of consciousness but denied any chest pain, cough, shortness of breath. ED course In the ED patient was found to have blood pressure 95/56 and improved to 122/63 Lab work significant for COVID testing negative, CBC remarkable for hemoglobin of 11.7, BMP notable for potassium of 3.3, CO2 21, glucose 113, creatinine 1.03 Initial troponin 0 0.22 UA showed small hemoglobin, large leukocytes, rare bacteria, WBC 21-50.  CT head obtained showed no acute intracranial abnormalities.  Atrophy, chronic microvascular disease. Chest x-ray showed no active cardiopulmonary disease In the ED patient was given  bolus of normal saline and IV potassium before tried hospitalist was admitted for  further management.  Remaining hospital course addressed in problem based format below:   Hospital Course:   Syncope secondary to orthostatic hypotension Patient found to have 32 mmHg drop in systolic blood pressure while in the ED.  During observation her amlodipine and Lasix were held.  Repeat orthostatic vitals after IV fluids show one elevated reading but no recurrent orthostasis.  Telemetry and EKG show no arrhythmias.  Echo personally reviewed by cardiology showed hyperdynamic LV LVEF with grade 1 diastolic dysfunction and no valvular abnormalities.  Cardiology recommended restarting her amlodipine at 2.5 mg p.o., starting metoprolol 25 mg twice daily and discontinue her Lasix.  Elevated troponin secondary to demand ischemia in setting of orthostatic syncope Patient remained asymptomatic troponin trended from 0.22-0.50-0.34.  EKG had no acute ischemic endings.  Echo showed no regional wall abnormalities.  Given lack of chest pain and advanced dementia cardiology patient not a candidate for ischemic work-up per cardiology.  Vertigo, continue prior to admission Antivert  Dementia, stable Aricept briefly held during observation can continue on discharge  Hypokalemia Orally repleted and returned to normal within 24 hours.  Consultations:  Cardiology  Procedures/Studies: TTE> 06/25/18   1. The left ventricle has hyperdynamic systolic function, with an ejection fraction of >65%. The cavity size was normal. There is mild concentric left ventricular hypertrophy. Left ventricular diastolic Doppler parameters are consistent with impaired  relaxation.  2. The right ventricle has normal systolic function. The cavity was normal. There is no increase in right ventricular wall thickness.   Discharge Exam: BP 119/69 (BP Location: Right Arm)    Pulse 90    Temp 99.2 F (37.3 C) (Oral)  Resp 18    Ht 5\' 7"  (1.702 m)    Wt 73.7 kg    SpO2 97%    BMI 25.45 kg/m   General: Lying in bed, no  apparent distress Eyes: EOMI, anicteric  ENT: Oral Mucosa clear and moist Cardiovascular: regular rate and rhythm, no murmurs, rubs or gallops, no edema, Respiratory: Normal respiratory effort, lungs clear to auscultation bilaterally Abdomen: soft, non-distended, non-tender, normal bowel sounds Skin: No Rash Neurologic: Grossly no focal neuro deficit.Mental status alert, oriented to self, year (not to month), context, not to place Psychiatric:Appropriate affect, and mood   Discharge Instructions You were cared for by a hospitalist during your hospital stay. If you have any questions about your discharge medications or the care you received while you were in the hospital after you are discharged, you can call the unit and asked to speak with the hospitalist on call if the hospitalist that took care of you is not available. Once you are discharged, your primary care physician will handle any further medical issues. Please note that NO REFILLS for any discharge medications will be authorized once you are discharged, as it is imperative that you return to your primary care physician (or establish a relationship with a primary care physician if you do not have one) for your aftercare needs so that they can reassess your need for medications and monitor your lab values.  Discharge Instructions    Diet - low sodium heart healthy   Complete by:  As directed    Increase activity slowly   Complete by:  As directed      Allergies as of 06/25/2018   No Known Allergies     Medication List    STOP taking these medications   cephALEXin 500 MG capsule Commonly known as:  KEFLEX     TAKE these medications   acetaminophen 325 MG tablet Commonly known as:  TYLENOL Take 650 mg by mouth every 6 (six) hours as needed for mild pain, moderate pain, fever or headache.   amLODipine 2.5 MG tablet Commonly known as:  NORVASC Take 4 tablets (10 mg total) by mouth daily. What changed:  medication strength     donepezil 5 MG tablet Commonly known as:  ARICEPT Take 5 mg by mouth at bedtime.   meclizine 25 MG tablet Commonly known as:  ANTIVERT Take 25 mg by mouth 2 (two) times daily.   metoprolol tartrate 25 MG tablet Commonly known as:  LOPRESSOR Take 1 tablet (25 mg total) by mouth 2 (two) times daily.   pregabalin 50 MG capsule Commonly known as:  LYRICA Take 50 mg by mouth at bedtime.   Rexulti 0.5 MG Tabs Generic drug:  Brexpiprazole Take 1 mg by mouth at bedtime.   valACYclovir 500 MG tablet Commonly known as:  VALTREX Take 500 mg by mouth 2 (two) times daily.      No Known Allergies    The results of significant diagnostics from this hospitalization (including imaging, microbiology, ancillary and laboratory) are listed below for reference.    Significant Diagnostic Studies: Dg Chest 2 View  Result Date: 06/24/2018 CLINICAL DATA:  Recent syncopal episode EXAM: CHEST - 2 VIEW COMPARISON:  None. FINDINGS: The heart size and mediastinal contours are within normal limits. Both lungs are clear. The visualized skeletal structures are unremarkable. IMPRESSION: No active cardiopulmonary disease. Electronically Signed   By: Alcide CleverMark  Lukens M.D.   On: 06/24/2018 22:46   Dg Thoracic Spine 2 View  Result Date: 06/09/2018  CLINICAL DATA:  Acute mid back pain following fall yesterday. Initial encounter. EXAM: THORACIC SPINE 2 VIEWS COMPARISON:  None. FINDINGS: There is no evidence of thoracic spine fracture. Alignment is normal. No other significant bone abnormalities are identified. IMPRESSION: Negative. Electronically Signed   By: Harmon Pier M.D.   On: 06/09/2018 14:54   Dg Lumbar Spine Complete  Result Date: 06/09/2018 CLINICAL DATA:  Acute low back pain following fall yesterday. Initial encounter. EXAM: LUMBAR SPINE - COMPLETE 4+ VIEW COMPARISON:  None. FINDINGS: There is no evidence of acute fracture or subluxation. Mild-to-moderate degenerative disc disease/spondylosis noted, greatest  at L4-5. Moderate facet arthropathy in the LOWER lumbar spine noted. No focal bony lesions or spondylolysis noted. Aortic atherosclerotic calcifications noted. IMPRESSION: 1. No acute abnormality 2. Mild-to-moderate multilevel degenerative changes. 3.  Aortic Atherosclerosis (ICD10-I70.0). Electronically Signed   By: Harmon Pier M.D.   On: 06/09/2018 14:52   Ct Head Wo Contrast  Result Date: 06/24/2018 CLINICAL DATA:  Dizziness, syncope EXAM: CT HEAD WITHOUT CONTRAST TECHNIQUE: Contiguous axial images were obtained from the base of the skull through the vertex without intravenous contrast. COMPARISON:  06/09/2018 FINDINGS: Brain: There is atrophy and chronic small vessel disease changes. No acute intracranial abnormality. Specifically, no hemorrhage, hydrocephalus, mass lesion, acute infarction, or significant intracranial injury. Vascular: No hyperdense vessel or unexpected calcification. Skull: No acute calvarial abnormality. Sinuses/Orbits: Visualized paranasal sinuses and mastoids clear. Orbital soft tissues unremarkable. Other: None IMPRESSION: Atrophy, chronic microvascular disease. No acute intracranial abnormality. Electronically Signed   By: Charlett Nose M.D.   On: 06/24/2018 23:19   Ct Head Wo Contrast  Result Date: 06/09/2018 CLINICAL DATA:  83 year old female with acute head and neck injury following fall yesterday. Initial encounter. EXAM: CT HEAD WITHOUT CONTRAST CT CERVICAL SPINE WITHOUT CONTRAST TECHNIQUE: Multidetector CT imaging of the head and cervical spine was performed following the standard protocol without intravenous contrast. Multiplanar CT image reconstructions of the cervical spine were also generated. COMPARISON:  None. FINDINGS: CT HEAD FINDINGS Brain: No evidence of acute infarction, hemorrhage, hydrocephalus, extra-axial collection or mass lesion/mass effect. Mild atrophy and chronic small-vessel white matter ischemic changes noted. Vascular: Carotid atherosclerotic  calcifications noted. Skull: Normal. Negative for fracture or focal lesion. Sinuses/Orbits: No acute finding. Other: None. CT CERVICAL SPINE FINDINGS Alignment: Normal. Skull base and vertebrae: No acute fracture. No primary bone lesion or focal pathologic process. Soft tissues and spinal canal: No prevertebral fluid or swelling. No visible canal hematoma. Disc levels: Mild-to-moderate degenerative disc disease/spondylosis from C4-C7 noted contributing to mild to moderate central spinal narrowing. Upper chest: No acute abnormality Other: None IMPRESSION: 1. No evidence of acute intracranial abnormality. Mild atrophy and chronic small-vessel white matter ischemic changes. 2. No static evidence of acute injury to the cervical spine. Mild to moderate degenerative changes from C4-C7. Electronically Signed   By: Harmon Pier M.D.   On: 06/09/2018 14:46   Ct Cervical Spine Wo Contrast  Result Date: 06/09/2018 CLINICAL DATA:  83 year old female with acute head and neck injury following fall yesterday. Initial encounter. EXAM: CT HEAD WITHOUT CONTRAST CT CERVICAL SPINE WITHOUT CONTRAST TECHNIQUE: Multidetector CT imaging of the head and cervical spine was performed following the standard protocol without intravenous contrast. Multiplanar CT image reconstructions of the cervical spine were also generated. COMPARISON:  None. FINDINGS: CT HEAD FINDINGS Brain: No evidence of acute infarction, hemorrhage, hydrocephalus, extra-axial collection or mass lesion/mass effect. Mild atrophy and chronic small-vessel white matter ischemic changes noted. Vascular: Carotid atherosclerotic calcifications noted. Skull:  Normal. Negative for fracture or focal lesion. Sinuses/Orbits: No acute finding. Other: None. CT CERVICAL SPINE FINDINGS Alignment: Normal. Skull base and vertebrae: No acute fracture. No primary bone lesion or focal pathologic process. Soft tissues and spinal canal: No prevertebral fluid or swelling. No visible canal  hematoma. Disc levels: Mild-to-moderate degenerative disc disease/spondylosis from C4-C7 noted contributing to mild to moderate central spinal narrowing. Upper chest: No acute abnormality Other: None IMPRESSION: 1. No evidence of acute intracranial abnormality. Mild atrophy and chronic small-vessel white matter ischemic changes. 2. No static evidence of acute injury to the cervical spine. Mild to moderate degenerative changes from C4-C7. Electronically Signed   By: Harmon Pier M.D.   On: 06/09/2018 14:46   Dg Hip Unilat With Pelvis 2-3 Views Left  Result Date: 06/09/2018 CLINICAL DATA:  Acute LEFT hip pain following fall yesterday. Initial encounter. EXAM: DG HIP (WITH OR WITHOUT PELVIS) 2-3V LEFT COMPARISON:  None. FINDINGS: There is no evidence of hip fracture or dislocation. There is no evidence of arthropathy or other focal bone abnormality. Degenerative changes in the LOWER lumbar spine noted. IMPRESSION: No acute abnormality. Electronically Signed   By: Harmon Pier M.D.   On: 06/09/2018 14:53    Microbiology: Recent Results (from the past 240 hour(s))  SARS Coronavirus 2 (CEPHEID - Performed in [facility] hospital lab), Hosp Order     Status: None   Collection Time: 06/24/18 11:59 PM  Result Value Ref Range Status   SARS Coronavirus 2 NEGATIVE NEGATIVE Final    Comment: (NOTE) If result is NEGATIVE SARS-CoV-2 target nucleic acids are NOT DETECTED. The SARS-CoV-2 RNA is generally detectable in upper and lower  respiratory specimens during the acute phase of infection. The lowest  concentration of SARS-CoV-2 viral copies this assay can detect is 250  copies / mL. A negative result does not preclude SARS-CoV-2 infection  and should not be used as the sole basis for treatment or other  patient management decisions.  A negative result may occur with  improper specimen collection / handling, submission of specimen other  than nasopharyngeal swab, presence of viral mutation(s) within the    areas targeted by this assay, and inadequate number of viral copies  (<250 copies / mL). A negative result must be combined with clinical  observations, patient history, and epidemiological information. If result is POSITIVE SARS-CoV-2 target nucleic acids are DETECTED. The SARS-CoV-2 RNA is generally detectable in upper and lower  respiratory specimens dur ing the acute phase of infection.  Positive  results are indicative of active infection with SARS-CoV-2.  Clinical  correlation with patient history and other diagnostic information is  necessary to determine patient infection status.  Positive results do  not rule out bacterial infection or co-infection with other viruses. If result is PRESUMPTIVE POSTIVE SARS-CoV-2 nucleic acids MAY BE PRESENT.   A presumptive positive result was obtained on the submitted specimen  and confirmed on repeat testing.  While 2019 novel coronavirus  (SARS-CoV-2) nucleic acids may be present in the submitted sample  additional confirmatory testing may be necessary for epidemiological  and / or clinical management purposes  to differentiate between  SARS-CoV-2 and other Sarbecovirus currently known to infect humans.  If clinically indicated additional testing with an alternate test  methodology [phone]) is advised. The SARS-CoV-2 RNA is generally  detectable in upper and lower respiratory sp ecimens during the acute  phase of infection. The expected result is Negative. Fact Sheet for Patients:  BoilerBrush.com.cy Fact Sheet for Healthcare  Providers: https://pope.com/ This test is not yet approved or cleared by the [place] and has been authorized for detection and/or diagnosis of SARS-CoV-2 by FDA under an Emergency Use Authorization (EUA).  This EUA will remain in effect (meaning this test can be used) for the duration of the COVID-19 declaration under Section 564(b)(1) of the Act, 21  U.S.C. section 360bbb-3(b)(1), unless the authorization is terminated or revoked sooner. Performed at [facility], [address], [place], [place] [address]      Labs: Basic Metabolic Panel: Recent Labs  Lab 06/24/18 2221 06/25/18 0407  NA 137 140  K 3.3* 3.6  CL 107 112*  CO2 21* 21*  GLUCOSE 113* 110*  BUN 14 12  CREATININE 1.03* 0.88  CALCIUM 9.2 8.8*  MG  --  1.8   Liver Function Tests: No results for input(s): AST, ALT, ALKPHOS, BILITOT, PROT, ALBUMIN in the last 168 hours. No results for input(s): LIPASE, AMYLASE in the last 168 hours. No results for input(s): AMMONIA in the last 168 hours. CBC: Recent Labs  Lab 06/24/18 2221 06/25/18 0407  WBC 8.5 8.3  NEUTROABS 6.9  --   HGB 11.7* 10.6*  HCT 36.0 32.8*  MCV 95.5 97.9  PLT 189 177   Cardiac Enzymes: Recent Labs  Lab 06/24/18 2221 06/25/18 0407 06/25/18 0955  TROPONINI 0.22* 0.50* 0.34*   BNP: BNP (last 3 results) No results for input(s): BNP in the last 8760 hours.  ProBNP (last 3 results) No results for input(s): PROBNP in the last 8760 hours.  CBG: Recent Labs  Lab 06/25/18 0532  GLUCAP 99       Signed:  Laverna Peace, MD [facility] 06/25/2018, 2:23 PM

## 2018-06-25 NOTE — Progress Notes (Signed)
2nd shift ED CSW received a handoff from the 1st shift [facility] ED CSW.   CSW received a call from pt's CSW stating PTAR needs to be scheduled for 8pm.  PTAR scheduled and aware pt will not need oxygen, or IV's and is FULL code.  Please reconsult if future social work needs arise.  CSW signing off, as social work intervention is no longer needed.  Dorothe Pea. Cyan Clippinger, LCSW, LCAS, CSI Transitions of Care Clinical Social Worker Care Coordination Department Ph: [phone]

## 2018-06-25 NOTE — Progress Notes (Signed)
  Echocardiogram 2D Echocardiogram with definity has been performed.  Leta Jungling M 06/25/2018, 11:47 AM

## 2018-06-27 LAB — URINE CULTURE: Culture: >=100000 — AB

## 2018-07-23 ENCOUNTER — Inpatient Hospital Stay (HOSPITAL_COMMUNITY)
Admission: EM | Admit: 2018-07-23 | Discharge: 2018-07-28 | DRG: 871 | Disposition: A | Discharge status: Skilled Nursing Facility | Payer: Medicare Other | Source: Skilled Nursing Facility | Attending: Internal Medicine | Admitting: Internal Medicine

## 2018-07-23 ENCOUNTER — Other Ambulatory Visit: Payer: Self-pay

## 2018-07-23 ENCOUNTER — Encounter (HOSPITAL_COMMUNITY): Payer: Self-pay

## 2018-07-23 DIAGNOSIS — N390 Urinary tract infection, site not specified: Secondary | ICD-10-CM | POA: Diagnosis present

## 2018-07-23 DIAGNOSIS — E871 Hypo-osmolality and hyponatremia: Secondary | ICD-10-CM | POA: Diagnosis present

## 2018-07-23 DIAGNOSIS — A419 Sepsis, unspecified organism: Secondary | ICD-10-CM

## 2018-07-23 DIAGNOSIS — N179 Acute kidney failure, unspecified: Secondary | ICD-10-CM | POA: Diagnosis present

## 2018-07-23 DIAGNOSIS — L89159 Pressure ulcer of sacral region, unspecified stage: Secondary | ICD-10-CM | POA: Diagnosis present

## 2018-07-23 DIAGNOSIS — D649 Anemia, unspecified: Secondary | ICD-10-CM | POA: Diagnosis present

## 2018-07-23 DIAGNOSIS — E860 Dehydration: Secondary | ICD-10-CM | POA: Diagnosis present

## 2018-07-23 DIAGNOSIS — R627 Adult failure to thrive: Secondary | ICD-10-CM | POA: Diagnosis present

## 2018-07-23 DIAGNOSIS — E876 Hypokalemia: Secondary | ICD-10-CM | POA: Diagnosis present

## 2018-07-23 DIAGNOSIS — I10 Essential (primary) hypertension: Secondary | ICD-10-CM | POA: Diagnosis present

## 2018-07-23 DIAGNOSIS — F0280 Dementia in other diseases classified elsewhere without behavioral disturbance: Secondary | ICD-10-CM | POA: Diagnosis present

## 2018-07-23 DIAGNOSIS — G9341 Metabolic encephalopathy: Secondary | ICD-10-CM | POA: Diagnosis present

## 2018-07-23 DIAGNOSIS — Z20828 Contact with and (suspected) exposure to other viral communicable diseases: Secondary | ICD-10-CM | POA: Diagnosis present

## 2018-07-23 DIAGNOSIS — R509 Fever, unspecified: Secondary | ICD-10-CM | POA: Diagnosis not present

## 2018-07-23 DIAGNOSIS — F0390 Unspecified dementia without behavioral disturbance: Secondary | ICD-10-CM | POA: Diagnosis present

## 2018-07-23 DIAGNOSIS — Z79899 Other long term (current) drug therapy: Secondary | ICD-10-CM

## 2018-07-23 DIAGNOSIS — J189 Pneumonia, unspecified organism: Secondary | ICD-10-CM | POA: Diagnosis present

## 2018-07-23 DIAGNOSIS — K219 Gastro-esophageal reflux disease without esophagitis: Secondary | ICD-10-CM | POA: Diagnosis present

## 2018-07-23 DIAGNOSIS — L8990 Pressure ulcer of unspecified site, unspecified stage: Secondary | ICD-10-CM

## 2018-07-23 DIAGNOSIS — J690 Pneumonitis due to inhalation of food and vomit: Secondary | ICD-10-CM | POA: Diagnosis present

## 2018-07-23 DIAGNOSIS — B965 Pseudomonas (aeruginosa) (mallei) (pseudomallei) as the cause of diseases classified elsewhere: Secondary | ICD-10-CM | POA: Diagnosis present

## 2018-07-23 DIAGNOSIS — E46 Unspecified protein-calorie malnutrition: Secondary | ICD-10-CM | POA: Diagnosis present

## 2018-07-23 MED ORDER — SODIUM CHLORIDE 0.9% FLUSH
3.0000 mL | Freq: Once | INTRAVENOUS | Status: AC
  Administered 2018-07-24: 3 mL via INTRAVENOUS

## 2018-07-23 NOTE — ED Notes (Signed)
Bed: WA06 Expected date:  Expected time:  Means of arrival:  Comments: EMS 83 yo female from SNF-[facility]-has not eaten or taken meds in 2 days

## 2018-07-23 NOTE — ED Provider Notes (Signed)
[facility]ER[facility]ENCY DEPT Provider Note: Lowella DellJ. Lane Morrill Bomkamp, MD, FACEP  CSN: [id number] MRN: [id number] ARRIVAL: 07/23/18 at 2255 ROOM: WA06/WA06   CHIEF COMPLAINT  Failure to thrive  Level 5 caveat: Dementia HISTORY OF PRESENT ILLNESS  07/23/18 11:54 PM Taylor Ruiz is a 83 y.o. female history of dementia.  She was sent from her nursing home for "failure to thrive".  She has not been eating or taking her medications.  She has increasing confusion beyond her baseline dementia.  On arrival she was noted to have a low-grade fever of 100.1 rectally, mild tachycardia and tachypnea.    Past Medical History:  Diagnosis Date  . Dementia (HCC)   . HTN (hypertension)   . Vertigo     History reviewed. No pertinent surgical history.  History reviewed. No pertinent family history.  Social History   Tobacco Use  . Smoking status: Never Smoker  . Smokeless tobacco: Never Used  . Tobacco comment: unsure if ever smoked or used smokeless tobacco  Substance Use Topics  . Alcohol use: Not on file  . Drug use: Not on file    Prior to Admission medications   Medication Sig Start Date End Date Taking? Authorizing Provider  acetaminophen (TYLENOL) 325 MG tablet Take 650 mg by mouth every 6 (six) hours as needed for mild pain, moderate pain, fever or headache.   Yes [provider]  amLODipine (NORVASC) 10 MG tablet Take 10 mg by mouth daily.   Yes [provider]  Brexpiprazole (REXULTI) 0.5 MG TABS Take 1 mg by mouth at bedtime.   Yes [provider]  donepezil (ARICEPT) 5 MG tablet Take 5 mg by mouth at bedtime.   Yes [provider]  meclizine (ANTIVERT) 25 MG tablet Take 25 mg by mouth 2 (two) times daily.   Yes [provider]  metoprolol tartrate (LOPRESSOR) 25 MG tablet Take 1 tablet (25 mg total) by mouth 2 (two) times daily. 06/25/18  Yes Roberto ScalesNettey, Shayla D, MD  pregabalin (LYRICA) 50 MG capsule Take 50 mg by mouth at bedtime.   Yes [provider]  valACYclovir (VALTREX) 500 MG tablet Take 500 mg by mouth 2 (two) times daily.   Yes [provider]    Allergies Patient has no known allergies.   REVIEW OF SYSTEMS  Negative except as noted here or in the History of Present Illness.   PHYSICAL EXAMINATION  Initial Vital Signs Blood pressure 117/71, pulse (!) 108, temperature 100.1 F (37.8 C), temperature source Rectal, resp. rate (!) 35, SpO2 100 %.  Examination General: Well-developed, well-nourished female in no acute distress; appearance consistent with age of record HENT: normocephalic; atraumatic Eyes: pupils equal, round and reactive to light; extraocular muscles cannot be assessed Neck: supple Heart: regular rate and rhythm; tachycardia Lungs: clear to auscultation bilaterally Abdomen: soft; nondistended; apparently nontender; bowel sounds present Extremities: No deformity; full range of motion; pulses normal; edema of lower legs Neurologic: Somnolent; will moan to noxious stimuli; will move upper extremities on command but not lower extremities  Skin: Warm and dry   RESULTS  Summary of this visit's results, reviewed by myself:   EKG Interpretation  Date/Time:    Ventricular Rate:    PR Interval:    QRS Duration:   QT Interval:    QTC Calculation:   R Axis:     Text Interpretation:        Laboratory Studies: Results for orders placed or performed during the hospital encounter of 07/23/18 (  from the past 24 hour(s))  Lactic acid, plasma     Status: None   Collection Time: 07/23/18 11:14 PM  Result Value Ref Range   Lactic Acid, Venous 1.9 0.5 - 1.9 mmol/L  SARS Coronavirus 2 (CEPHEID- Performed in [facility] hospital lab), Hosp Order     Status: None   Collection Time: 07/24/18 12:59 AM  Result Value Ref Range   SARS Coronavirus 2 NEGATIVE NEGATIVE  Comprehensive metabolic panel     Status: Abnormal   Collection Time: 07/24/18  1:11 AM  Result Value Ref Range   Sodium 146  (H) 135 - 145 mmol/L   Potassium 3.7 3.5 - 5.1 mmol/L   Chloride 116 (H) 98 - 111 mmol/L   CO2 20 (L) 22 - 32 mmol/L   Glucose, Bld 108 (H) 70 - 99 mg/dL   BUN 49 (H) 8 - 23 mg/dL   Creatinine, Ser 4.09 (H) 0.44 - 1.00 mg/dL   Calcium 9.3 8.9 - 81.1 mg/dL   Total Protein 8.5 (H) 6.5 - 8.1 g/dL   Albumin 2.9 (L) 3.5 - 5.0 g/dL   AST 56 (H) 15 - 41 U/L   ALT 33 0 - 44 U/L   Alkaline Phosphatase 71 38 - 126 U/L   Total Bilirubin 0.7 0.3 - 1.2 mg/dL   GFR calc non Af Amer 30 (L) >60 mL/min   GFR calc Af Amer 35 (L) >60 mL/min   Anion gap 10 5 - 15  CBC with Differential     Status: Abnormal   Collection Time: 07/24/18  1:11 AM  Result Value Ref Range   WBC 20.5 (H) 4.0 - 10.5 K/uL   RBC 3.62 (L) 3.87 - 5.11 MIL/uL   Hemoglobin 10.9 (L) 12.0 - 15.0 g/dL   HCT 91.4 (L) 78.2 - 95.6 %   MCV 96.1 80.0 - 100.0 fL   MCH 30.1 26.0 - 34.0 pg   MCHC 31.3 30.0 - 36.0 g/dL   RDW 21.3 08.6 - 57.8 %   Platelets 209 150 - 400 K/uL   nRBC 0.0 0.0 - 0.2 %   Neutrophils Relative % 84 %   Neutro Abs 17.3 (H) 1.7 - 7.7 K/uL   Lymphocytes Relative 8 %   Lymphs Abs 1.7 0.7 - 4.0 K/uL   Monocytes Relative 7 %   Monocytes Absolute 1.3 (H) 0.1 - 1.0 K/uL   Eosinophils Relative 0 %   Eosinophils Absolute 0.0 0.0 - 0.5 K/uL   Basophils Relative 0 %   Basophils Absolute 0.0 0.0 - 0.1 K/uL   Immature Granulocytes 1 %   Abs Immature Granulocytes 0.16 (H) 0.00 - 0.07 K/uL  Protime-INR     Status: None   Collection Time: 07/24/18  1:11 AM  Result Value Ref Range   Prothrombin Time 14.4 11.4 - 15.2 seconds   INR 1.1 0.8 - 1.2  Urinalysis, Routine w reflex microscopic     Status: Abnormal   Collection Time: 07/24/18  1:30 AM  Result Value Ref Range   Color, Urine YELLOW YELLOW   APPearance CLEAR CLEAR   Specific Gravity, Urine 1.020 1.005 - 1.030   pH 5.5 5.0 - 8.0   Glucose, UA NEGATIVE NEGATIVE mg/dL   Hgb urine dipstick MODERATE (A) NEGATIVE   Bilirubin Urine NEGATIVE NEGATIVE   Ketones, ur  NEGATIVE NEGATIVE mg/dL   Protein, ur TRACE (A) NEGATIVE mg/dL   Nitrite NEGATIVE NEGATIVE   Leukocytes,Ua TRACE (A) NEGATIVE  Urinalysis, Microscopic (reflex)  Status: Abnormal   Collection Time: 07/24/18  1:30 AM  Result Value Ref Range   RBC / HPF 6-10 0 - 5 RBC/hpf   WBC, UA 0-5 0 - 5 WBC/hpf   Bacteria, UA MANY (A) NONE SEEN   Squamous Epithelial / LPF 0-5 0 - 5   Mucus PRESENT    Imaging Studies: Dg Chest Port 1 View  Result Date: 07/24/2018 CLINICAL DATA:  83 year old female with sepsis. EXAM: PORTABLE CHEST 1 VIEW COMPARISON:  Earlier radiograph dated 07/24/2018 FINDINGS: There is a background of emphysema and chronic bronchitic changes. An area of diffuse hazy density in the right lower lung field appears new since the radiograph of 06/24/2018 and concerning for developing infiltrate. There is no pleural effusion or pneumothorax. The cardiac silhouette is within normal limits. No acute osseous pathology. IMPRESSION: Right lower lung field hazy density concerning for developing infiltrate. Clinical correlation and follow-up recommended. Electronically Signed   By: Elgie Collard M.D.   On: 07/24/2018 04:06   Dg Chest Port 1 View  Result Date: 07/24/2018 CLINICAL DATA:  Sepsis EXAM: PORTABLE CHEST 1 VIEW COMPARISON:  06/24/2018 FINDINGS: There is a diffuse hazy appearance of the right lower lung field. There is blunting of the right costophrenic angle, however this is not well visualized secondary to overlapping wires. The lung apices are suboptimally evaluated secondary to overlapping structures. There is no acute osseous abnormality. No detection of a pneumothorax. IMPRESSION: Findings concerning for developing right lower lobe pneumonia, however evaluation is limited by suboptimal patient positioning. Consider a short interval repeat chest x-ray for further evaluation. Electronically Signed   By: Katherine Mantle M.D.   On: 07/24/2018 02:02    ED COURSE and MDM  Nursing notes  and initial vitals signs, including pulse oximetry, reviewed.  Vitals:   07/24/18 0130 07/24/18 0230 07/24/18 0300 07/24/18 0336  BP: (!) 111/56 109/77 104/60 (!) 106/57  Pulse: (!) 103   93  Resp: (!) 34 (!) 25 (!) 24 (!) 25  Temp:      TempSrc:      SpO2: 94%   94%   Taylor Ruiz was evaluated in Emergency Department on 07/24/2018 for the symptoms described in the history of present illness. She was evaluated in the context of the global COVID-19 pandemic, which necessitated consideration that the patient might be at risk for infection with the SARS-CoV-2 virus that causes COVID-19. Institutional protocols and algorithms that pertain to the evaluation of patients at risk for COVID-19 are in a state of rapid change based on information released by regulatory bodies including the CDC and federal and state organizations. These policies and algorithms were followed during the patient's care in the ED.  12:03 AM Sepsis protocol initiated.  Patient's respiratory rate is less than that recorded in her initial vital signs.  IV fluid bolus given.  3:34 AM Rocephin and Zithromax IV started for suspected healthcare associated pneumonia.  PROCEDURES   CRITICAL CARE Performed by: Carlisle Beers Brantley Wiley Total critical care time: 30 minutes Critical care time was exclusive of separately billable procedures and treating other patients. Critical care was necessary to treat or prevent imminent or life-threatening deterioration. Critical care was time spent personally by me on the following activities: development of treatment plan with patient and/or surrogate as well as nursing, discussions with consultants, evaluation of patient's response to treatment, examination of patient, obtaining history from patient or surrogate, ordering and performing treatments and interventions, ordering and review of laboratory studies, ordering and review  of radiographic studies, pulse oximetry and re-evaluation of patient's  condition.   ED DIAGNOSES     ICD-10-CM   1. HCAP (healthcare-associated pneumonia) J18.9   2. Sepsis (HCC) A41.9 DG Chest [facility] 1 View    DG Chest Port 1 View  3. Dehydration E86.0   4. AKI (acute kidney injury) (HCC) N17.9        Saide Lanuza, MD 07/24/18 [phone]

## 2018-07-23 NOTE — ED Notes (Signed)
Dr. Molpus notified 

## 2018-07-23 NOTE — ED Triage Notes (Signed)
Per EMS, Pt is from [facility] nursing home. Pt sent out due to failure to thrive. Pt not eating/taking meds. Pt has baseline of confusion, but pt has had increased confusion.

## 2018-07-24 ENCOUNTER — Emergency Department (HOSPITAL_COMMUNITY): Payer: Medicare Other

## 2018-07-24 DIAGNOSIS — L8990 Pressure ulcer of unspecified site, unspecified stage: Secondary | ICD-10-CM

## 2018-07-24 DIAGNOSIS — Z515 Encounter for palliative care: Secondary | ICD-10-CM | POA: Diagnosis not present

## 2018-07-24 DIAGNOSIS — R627 Adult failure to thrive: Secondary | ICD-10-CM | POA: Diagnosis present

## 2018-07-24 DIAGNOSIS — Z20828 Contact with and (suspected) exposure to other viral communicable diseases: Secondary | ICD-10-CM | POA: Diagnosis present

## 2018-07-24 DIAGNOSIS — E860 Dehydration: Secondary | ICD-10-CM | POA: Diagnosis not present

## 2018-07-24 DIAGNOSIS — F0280 Dementia in other diseases classified elsewhere without behavioral disturbance: Secondary | ICD-10-CM | POA: Diagnosis present

## 2018-07-24 DIAGNOSIS — N179 Acute kidney failure, unspecified: Secondary | ICD-10-CM | POA: Diagnosis present

## 2018-07-24 DIAGNOSIS — Z7189 Other specified counseling: Secondary | ICD-10-CM | POA: Diagnosis not present

## 2018-07-24 DIAGNOSIS — G300 Alzheimer's disease with early onset: Secondary | ICD-10-CM | POA: Diagnosis not present

## 2018-07-24 DIAGNOSIS — G9341 Metabolic encephalopathy: Secondary | ICD-10-CM

## 2018-07-24 DIAGNOSIS — J189 Pneumonia, unspecified organism: Secondary | ICD-10-CM

## 2018-07-24 DIAGNOSIS — R509 Fever, unspecified: Secondary | ICD-10-CM | POA: Diagnosis present

## 2018-07-24 DIAGNOSIS — I10 Essential (primary) hypertension: Secondary | ICD-10-CM | POA: Diagnosis present

## 2018-07-24 DIAGNOSIS — Z79899 Other long term (current) drug therapy: Secondary | ICD-10-CM | POA: Diagnosis not present

## 2018-07-24 DIAGNOSIS — L89159 Pressure ulcer of sacral region, unspecified stage: Secondary | ICD-10-CM | POA: Diagnosis present

## 2018-07-24 DIAGNOSIS — K219 Gastro-esophageal reflux disease without esophagitis: Secondary | ICD-10-CM | POA: Diagnosis present

## 2018-07-24 DIAGNOSIS — A419 Sepsis, unspecified organism: Secondary | ICD-10-CM | POA: Diagnosis present

## 2018-07-24 DIAGNOSIS — N390 Urinary tract infection, site not specified: Secondary | ICD-10-CM | POA: Diagnosis present

## 2018-07-24 DIAGNOSIS — E871 Hypo-osmolality and hyponatremia: Secondary | ICD-10-CM | POA: Diagnosis present

## 2018-07-24 DIAGNOSIS — E46 Unspecified protein-calorie malnutrition: Secondary | ICD-10-CM | POA: Diagnosis present

## 2018-07-24 DIAGNOSIS — E876 Hypokalemia: Secondary | ICD-10-CM | POA: Diagnosis present

## 2018-07-24 DIAGNOSIS — J690 Pneumonitis due to inhalation of food and vomit: Secondary | ICD-10-CM | POA: Diagnosis present

## 2018-07-24 DIAGNOSIS — D649 Anemia, unspecified: Secondary | ICD-10-CM | POA: Diagnosis present

## 2018-07-24 DIAGNOSIS — B965 Pseudomonas (aeruginosa) (mallei) (pseudomallei) as the cause of diseases classified elsewhere: Secondary | ICD-10-CM | POA: Diagnosis present

## 2018-07-24 LAB — COMPREHENSIVE METABOLIC PANEL
ALT: 33 U/L (ref 0–44)
AST: 56 U/L — ABNORMAL HIGH (ref 15–41)
Albumin: 2.9 g/dL — ABNORMAL LOW (ref 3.5–5.0)
Alkaline Phosphatase: 71 U/L (ref 38–126)
Anion gap: 10 (ref 5–15)
BUN: 49 mg/dL — ABNORMAL HIGH (ref 8–23)
CO2: 20 mmol/L — ABNORMAL LOW (ref 22–32)
Calcium: 9.3 mg/dL (ref 8.9–10.3)
Chloride: 116 mmol/L — ABNORMAL HIGH (ref 98–111)
Creatinine, Ser: 1.6 mg/dL — ABNORMAL HIGH (ref 0.44–1.00)
GFR calc Af Amer: 35 mL/min — ABNORMAL LOW (ref >60)
GFR calc non Af Amer: 30 mL/min — ABNORMAL LOW (ref >60)
Glucose, Bld: 108 mg/dL — ABNORMAL HIGH (ref 70–99)
Potassium: 3.7 mmol/L (ref 3.5–5.1)
Sodium: 146 mmol/L — ABNORMAL HIGH (ref 135–145)
Total Bilirubin: 0.7 mg/dL (ref 0.3–1.2)
Total Protein: 8.5 g/dL — ABNORMAL HIGH (ref 6.5–8.1)

## 2018-07-24 LAB — URINALYSIS, ROUTINE W REFLEX MICROSCOPIC
Bilirubin Urine: NEGATIVE
Bilirubin Urine: NEGATIVE
Glucose, UA: NEGATIVE mg/dL
Glucose, UA: NEGATIVE mg/dL
Ketones, ur: NEGATIVE mg/dL
Ketones, ur: NEGATIVE mg/dL
Nitrite: NEGATIVE
Nitrite: POSITIVE — AB
Protein, ur: 30 mg/dL — AB
Specific Gravity, Urine: 1.02 (ref 1.005–1.030)
Specific Gravity, Urine: 1.021 (ref 1.005–1.030)
pH: 5.5 (ref 5.0–8.0)
pH: 5 (ref 5.0–8.0)

## 2018-07-24 LAB — URINALYSIS, MICROSCOPIC (REFLEX)

## 2018-07-24 LAB — PROTIME-INR
INR: 1.1 (ref 0.8–1.2)
Prothrombin Time: 14.4 seconds (ref 11.4–15.2)

## 2018-07-24 LAB — CBC WITH DIFFERENTIAL/PLATELET
Abs Immature Granulocytes: 0.16 10*3/uL — ABNORMAL HIGH (ref 0.00–0.07)
Basophils Absolute: 0 10*3/uL (ref 0.0–0.1)
Basophils Relative: 0 %
Eosinophils Absolute: 0 10*3/uL (ref 0.0–0.5)
Eosinophils Relative: 0 %
HCT: 34.8 % — ABNORMAL LOW (ref 36.0–46.0)
Hemoglobin: 10.9 g/dL — ABNORMAL LOW (ref 12.0–15.0)
Immature Granulocytes: 1 %
Lymphocytes Relative: 8 %
Lymphs Abs: 1.7 10*3/uL (ref 0.7–4.0)
MCH: 30.1 pg (ref 26.0–34.0)
MCHC: 31.3 g/dL (ref 30.0–36.0)
MCV: 96.1 fL (ref 80.0–100.0)
Monocytes Absolute: 1.3 10*3/uL — ABNORMAL HIGH (ref 0.1–1.0)
Monocytes Relative: 7 %
Neutro Abs: 17.3 10*3/uL — ABNORMAL HIGH (ref 1.7–7.7)
Neutrophils Relative %: 84 %
Platelets: 209 10*3/uL (ref 150–400)
RBC: 3.62 MIL/uL — ABNORMAL LOW (ref 3.87–5.11)
RDW: 15.4 % (ref 11.5–15.5)
WBC: 20.5 10*3/uL — ABNORMAL HIGH (ref 4.0–10.5)
nRBC: 0 % (ref 0.0–0.2)

## 2018-07-24 LAB — MRSA PCR SCREENING: MRSA by PCR: NEGATIVE

## 2018-07-24 LAB — LACTIC ACID, PLASMA: Lactic Acid, Venous: 1.9 mmol/L (ref 0.5–1.9)

## 2018-07-24 LAB — SARS CORONAVIRUS 2 BY RT PCR (HOSPITAL ORDER, PERFORMED IN ~~LOC~~ HOSPITAL LAB): SARS Coronavirus 2: NEGATIVE

## 2018-07-24 LAB — STREP PNEUMONIAE URINARY ANTIGEN: Strep Pneumo Urinary Antigen: NEGATIVE

## 2018-07-24 MED ORDER — BREXPIPRAZOLE 1 MG PO TABS
1.0000 mg | ORAL_TABLET | Freq: Every day | ORAL | Status: DC
  Administered 2018-07-24 – 2018-07-27 (×3): 1 mg via ORAL
  Filled 2018-07-24 (×4): qty 1

## 2018-07-24 MED ORDER — METOPROLOL TARTRATE 25 MG PO TABS
25.0000 mg | ORAL_TABLET | Freq: Two times a day (BID) | ORAL | Status: DC
  Administered 2018-07-24 – 2018-07-28 (×8): 25 mg via ORAL
  Filled 2018-07-24 (×9): qty 1

## 2018-07-24 MED ORDER — SODIUM CHLORIDE 0.9 % IV BOLUS
1000.0000 mL | Freq: Once | INTRAVENOUS | Status: AC
  Administered 2018-07-24: 1000 mL via INTRAVENOUS

## 2018-07-24 MED ORDER — SODIUM CHLORIDE 0.9 % IV SOLN: 1.0000 g | Freq: Three times a day (TID) | INTRAVENOUS | Status: DC

## 2018-07-24 MED ORDER — BREXPIPRAZOLE 0.5 MG PO TABS: 1.0000 mg | ORAL_TABLET | Freq: Every day | ORAL | Status: DC

## 2018-07-24 MED ORDER — SODIUM CHLORIDE 0.9 % IV SOLN
1.0000 g | Freq: Once | INTRAVENOUS | Status: AC
  Administered 2018-07-24: 05:00:00 1 g via INTRAVENOUS
  Filled 2018-07-24: qty 10

## 2018-07-24 MED ORDER — ENOXAPARIN SODIUM 30 MG/0.3ML ~~LOC~~ SOLN
30.0000 mg | SUBCUTANEOUS | Status: DC
  Administered 2018-07-24 – 2018-07-28 (×5): 30 mg via SUBCUTANEOUS
  Filled 2018-07-24 (×5): qty 0.3

## 2018-07-24 MED ORDER — MECLIZINE HCL 25 MG PO TABS
25.0000 mg | ORAL_TABLET | Freq: Two times a day (BID) | ORAL | Status: DC
  Administered 2018-07-24 – 2018-07-28 (×8): 25 mg via ORAL
  Filled 2018-07-24 (×9): qty 1

## 2018-07-24 MED ORDER — ENSURE ENLIVE PO LIQD
237.0000 mL | Freq: Two times a day (BID) | ORAL | Status: DC
  Administered 2018-07-24 – 2018-07-28 (×4): 237 mL via ORAL

## 2018-07-24 MED ORDER — SODIUM CHLORIDE 0.9 % IV SOLN
500.0000 mg | Freq: Once | INTRAVENOUS | Status: AC
  Administered 2018-07-24: 500 mg via INTRAVENOUS
  Filled 2018-07-24 (×2): qty 500

## 2018-07-24 MED ORDER — GERHARDT'S BUTT CREAM
TOPICAL_CREAM | Freq: Two times a day (BID) | CUTANEOUS | Status: DC
  Administered 2018-07-24: 10:00:00 via TOPICAL
  Administered 2018-07-24: 1 via TOPICAL
  Administered 2018-07-25: 10:00:00 via TOPICAL
  Administered 2018-07-25: 1 via TOPICAL
  Administered 2018-07-26: 09:00:00 via TOPICAL
  Administered 2018-07-26 – 2018-07-27 (×2): 1 via TOPICAL
  Administered 2018-07-27 – 2018-07-28 (×2): via TOPICAL
  Filled 2018-07-24: qty 1

## 2018-07-24 MED ORDER — SODIUM CHLORIDE 0.9 % IV SOLN
2.0000 g | INTRAVENOUS | Status: DC
  Administered 2018-07-24 – 2018-07-26 (×3): 2 g via INTRAVENOUS
  Filled 2018-07-24 (×3): qty 2

## 2018-07-24 MED ORDER — SODIUM CHLORIDE 0.9 % IV SOLN
INTRAVENOUS | Status: DC
  Administered 2018-07-24: 10:00:00 via INTRAVENOUS

## 2018-07-24 MED ORDER — VANCOMYCIN HCL 10 G IV SOLR
1500.0000 mg | Freq: Once | INTRAVENOUS | Status: AC
  Administered 2018-07-24: 1500 mg via INTRAVENOUS
  Filled 2018-07-24: qty 1500

## 2018-07-24 MED ORDER — DONEPEZIL HCL 5 MG PO TABS
5.0000 mg | ORAL_TABLET | Freq: Every day | ORAL | Status: DC
  Administered 2018-07-24 – 2018-07-27 (×3): 5 mg via ORAL
  Filled 2018-07-24 (×4): qty 1

## 2018-07-24 MED ORDER — VALACYCLOVIR HCL 500 MG PO TABS
500.0000 mg | ORAL_TABLET | Freq: Two times a day (BID) | ORAL | Status: DC
  Administered 2018-07-24 – 2018-07-28 (×8): 500 mg via ORAL
  Filled 2018-07-24 (×9): qty 1

## 2018-07-24 MED ORDER — SODIUM CHLORIDE 0.9% FLUSH
10.0000 mL | INTRAVENOUS | Status: DC | PRN
  Administered 2018-07-24: 10 mL
  Filled 2018-07-24: qty 40

## 2018-07-24 MED ORDER — SODIUM CHLORIDE 0.9% FLUSH
10.0000 mL | Freq: Two times a day (BID) | INTRAVENOUS | Status: DC
  Administered 2018-07-24 – 2018-07-26 (×4): 10 mL

## 2018-07-24 MED ORDER — ORAL CARE MOUTH RINSE
15.0000 mL | Freq: Two times a day (BID) | OROMUCOSAL | Status: DC
  Administered 2018-07-24 – 2018-07-28 (×8): 15 mL via OROMUCOSAL

## 2018-07-24 NOTE — ED Notes (Signed)
ED TO INPATIENT HANDOFF REPORT  ED Nurse Name and Phone #: jon wled   S Name/Age/Gender Taylor Ruiz 83 y.o. female Room/Bed: WA06/WA06  Code Status   Code Status: Prior  Home/SNF/Other Skilled nursing facility {Patient oriented failure to thieve   Is this baseline? N/a unable to determine baseline   Triage Complete: Triage complete  Chief Complaint Failure to thrive  Triage Note Per EMS, Pt is from [facility] nursing home. Pt sent out due to failure to thrive. Pt not eating/taking meds. Pt has baseline of confusion, but pt has had increased confusion.    Allergies No Known Allergies  Level of Care/Admitting Diagnosis ED Disposition    ED Disposition Condition Comment   Admit  Hospital Area: [facility] [[id number]]  Level of Care: Telemetry [5]  Admit to tele based on following criteria: Other see comments  Comments: monitor hemodynamics  Covid Evaluation: Confirmed COVID Negative  Diagnosis: Pneumonia [227785]  Admitting Physician: [name] [[id number]]  Attending Physician: [name] [[id number]]  Estimated length of stay: past midnight tomorrow  Certification:: I certify this patient will need inpatient services for at least 2 midnights  PT Class (Do Not Modify): Inpatient [101]  PT Acc Code (Do Not Modify): Private [1]       B Medical/Surgery History Past Medical History:  Diagnosis Date  . Dementia (HCC)   . HTN (hypertension)   . Vertigo    History reviewed. No pertinent surgical history.   A IV Location/Drains/Wounds Patient Lines/Drains/Airways Status   Active Line/Drains/Airways    Name:   Placement date:   Placement time:   Site:   Days:   Peripheral IV 07/23/18 Left Hand   07/23/18    2333    Hand   1   Midline Single Lumen 07/24/18 Midline Left Basilic 8 cm 0 cm   07/24/18    0133    Basilic   less than 1   External Urinary Catheter   06/25/18    0140    -   29          Intake/Output Last 24  hours  Intake/Output Summary (Last 24 hours) at 07/24/2018 0506 Last data filed at 07/24/2018 0138 Gross per 24 hour  Intake -  Output 151 ml  Net -151 ml    Labs/Imaging Results for orders placed or performed during the hospital encounter of 07/23/18 (from the past 48 hour(s))  Lactic acid, plasma     Status: None   Collection Time: 07/23/18 11:14 PM  Result Value Ref Range   Lactic Acid, Venous 1.9 0.5 - 1.9 mmol/L    Comment: Performed at [facility], [address] [place], [place] [address]  SARS Coronavirus 2 (CEPHEID- Performed in [facility] hospital lab), Hosp Order     Status: None   Collection Time: 07/24/18 12:59 AM  Result Value Ref Range   SARS Coronavirus 2 NEGATIVE NEGATIVE    Comment: (NOTE) If result is NEGATIVE SARS-CoV-2 target nucleic acids are NOT DETECTED. The SARS-CoV-2 RNA is generally detectable in upper and lower  respiratory specimens during the acute phase of infection. The lowest  concentration of SARS-CoV-2 viral copies this assay can detect is 250  copies / mL. A negative result does not preclude SARS-CoV-2 infection  and should not be used as the sole basis for treatment or other  patient management decisions.  A negative result may occur with  improper specimen collection / handling, submission of specimen other  than nasopharyngeal swab, presence of viral mutation(s) within the  areas targeted by this assay, and inadequate number of viral copies  (<250 copies / mL). A negative result must be combined with clinical  observations, patient history, and epidemiological information. If result is POSITIVE SARS-CoV-2 target nucleic acids are DETECTED. The SARS-CoV-2 RNA is generally detectable in upper and lower  respiratory specimens dur ing the acute phase of infection.  Positive  results are indicative of active infection with SARS-CoV-2.  Clinical  correlation with patient history and other diagnostic information is  necessary  to determine patient infection status.  Positive results do  not rule out bacterial infection or co-infection with other viruses. If result is PRESUMPTIVE POSTIVE SARS-CoV-2 nucleic acids MAY BE PRESENT.   A presumptive positive result was obtained on the submitted specimen  and confirmed on repeat testing.  While 2019 novel coronavirus  (SARS-CoV-2) nucleic acids may be present in the submitted sample  additional confirmatory testing may be necessary for epidemiological  and / or clinical management purposes  to differentiate between  SARS-CoV-2 and other Sarbecovirus currently known to infect humans.  If clinically indicated additional testing with an alternate test  methodology [phone]) is advised. The SARS-CoV-2 RNA is generally  detectable in upper and lower respiratory sp ecimens during the acute  phase of infection. The expected result is Negative. Fact Sheet for Patients:  BoilerBrush.com.cy Fact Sheet for Healthcare Providers: https://pope.com/ This test is not yet approved or cleared by the [place] FDA and has been authorized for detection and/or diagnosis of SARS-CoV-2 by FDA under an Emergency Use Authorization (EUA).  This EUA will remain in effect (meaning this test can be used) for the duration of the COVID-19 declaration under Section 564(b)(1) of the Act, 21 U.S.C. section 360bbb-3(b)(1), unless the authorization is terminated or revoked sooner. Performed at [facility], [address] [place], [place] [address]   Comprehensive metabolic panel     Status: Abnormal   Collection Time: 07/24/18  1:11 AM  Result Value Ref Range   Sodium 146 (H) 135 - 145 mmol/L   Potassium 3.7 3.5 - 5.1 mmol/L   Chloride 116 (H) 98 - 111 mmol/L   CO2 20 (L) 22 - 32 mmol/L   Glucose, Bld 108 (H) 70 - 99 mg/dL   BUN 49 (H) 8 - 23 mg/dL   Creatinine, Ser 8.11 (H) 0.44 - 1.00 mg/dL   Calcium 9.3 8.9 - 91.4 mg/dL    Total Protein 8.5 (H) 6.5 - 8.1 g/dL   Albumin 2.9 (L) 3.5 - 5.0 g/dL   AST 56 (H) 15 - 41 U/L   ALT 33 0 - 44 U/L   Alkaline Phosphatase 71 38 - 126 U/L   Total Bilirubin 0.7 0.3 - 1.2 mg/dL   GFR calc non Af Amer 30 (L) >60 mL/min   GFR calc Af Amer 35 (L) >60 mL/min   Anion gap 10 5 - 15    Comment: Performed at [facility], [address], [place], [place] [address]  CBC with Differential     Status: Abnormal   Collection Time: 07/24/18  1:11 AM  Result Value Ref Range   WBC 20.5 (H) 4.0 - 10.5 K/uL   RBC 3.62 (L) 3.87 - 5.11 MIL/uL   Hemoglobin 10.9 (L) 12.0 - 15.0 g/dL   HCT 62.1 (L) 30.8 - 65.7 %   MCV 96.1 80.0 - 100.0 fL   MCH 30.1 26.0 - 34.0 pg  MCHC 31.3 30.0 - 36.0 g/dL   RDW 45.4 09.8 - 11.9 %   Platelets 209 150 - 400 K/uL   nRBC 0.0 0.0 - 0.2 %   Neutrophils Relative % 84 %   Neutro Abs 17.3 (H) 1.7 - 7.7 K/uL   Lymphocytes Relative 8 %   Lymphs Abs 1.7 0.7 - 4.0 K/uL   Monocytes Relative 7 %   Monocytes Absolute 1.3 (H) 0.1 - 1.0 K/uL   Eosinophils Relative 0 %   Eosinophils Absolute 0.0 0.0 - 0.5 K/uL   Basophils Relative 0 %   Basophils Absolute 0.0 0.0 - 0.1 K/uL   Immature Granulocytes 1 %   Abs Immature Granulocytes 0.16 (H) 0.00 - 0.07 K/uL    Comment: Performed at [facility], [address] [place], [place] [address]  Protime-INR     Status: None   Collection Time: 07/24/18  1:11 AM  Result Value Ref Range   Prothrombin Time 14.4 11.4 - 15.2 seconds   INR 1.1 0.8 - 1.2    Comment: (NOTE) INR goal varies based on device and disease states. Performed at [facility], [address], [place], [place] [address]   Urinalysis, Routine w reflex microscopic     Status: Abnormal   Collection Time: 07/24/18  1:30 AM  Result Value Ref Range   Color, Urine YELLOW YELLOW   APPearance CLEAR CLEAR   Specific Gravity, Urine 1.020 1.005 - 1.030   pH 5.5 5.0 - 8.0   Glucose, UA NEGATIVE  NEGATIVE mg/dL   Hgb urine dipstick MODERATE (A) NEGATIVE   Bilirubin Urine NEGATIVE NEGATIVE   Ketones, ur NEGATIVE NEGATIVE mg/dL   Protein, ur TRACE (A) NEGATIVE mg/dL   Nitrite NEGATIVE NEGATIVE   Leukocytes,Ua TRACE (A) NEGATIVE    Comment: Performed at [facility], [address], [place], [place] [address]  Urinalysis, Microscopic (reflex)     Status: Abnormal   Collection Time: 07/24/18  1:30 AM  Result Value Ref Range   RBC / HPF 6-10 0 - 5 RBC/hpf   WBC, UA 0-5 0 - 5 WBC/hpf   Bacteria, UA MANY (A) NONE SEEN   Squamous Epithelial / LPF 0-5 0 - 5   Mucus PRESENT     Comment: Performed at [facility], [address], [place], [place] [address]   Dg Chest Port 1 View  Result Date: 07/24/2018 CLINICAL DATA:  83 year old female with sepsis. EXAM: PORTABLE CHEST 1 VIEW COMPARISON:  Earlier radiograph dated 07/24/2018 FINDINGS: There is a background of emphysema and chronic bronchitic changes. An area of diffuse hazy density in the right lower lung field appears new since the radiograph of 06/24/2018 and concerning for developing infiltrate. There is no pleural effusion or pneumothorax. The cardiac silhouette is within normal limits. No acute osseous pathology. IMPRESSION: Right lower lung field hazy density concerning for developing infiltrate. Clinical correlation and follow-up recommended. Electronically Signed   By: Elgie Collard M.D.   On: 07/24/2018 04:06   Dg Chest Port 1 View  Result Date: 07/24/2018 CLINICAL DATA:  Sepsis EXAM: PORTABLE CHEST 1 VIEW COMPARISON:  06/24/2018 FINDINGS: There is a diffuse hazy appearance of the right lower lung field. There is blunting of the right costophrenic angle, however this is not well visualized secondary to overlapping wires. The lung apices are suboptimally evaluated secondary to overlapping structures. There is no acute osseous abnormality. No detection of a pneumothorax. IMPRESSION: Findings  concerning for developing right  lower lobe pneumonia, however evaluation is limited by suboptimal patient positioning. Consider a short interval repeat chest x-ray for further evaluation. Electronically Signed   By: Katherine Mantlehristopher  Green M.D.   On: 07/24/2018 02:02    Pending Labs Unresulted Labs (From admission, onward)    Start     Ordered   07/23/18 2314  Culture, blood (Routine x 2)  BLOOD CULTURE X 2,   STAT     07/23/18 2314          Vitals/Pain Today's Vitals   07/24/18 0300 07/24/18 0336 07/24/18 0500 07/24/18 0505  BP: 104/60 (!) 106/57 108/66 112/64  Pulse:  93  89  Resp: (!) 24 (!) 25 (!) 21 (!) 21  Temp:      TempSrc:      SpO2:  94% 95% 94%  PainSc:  Asleep  Asleep    Isolation Precautions Droplet and Contact precautions  Medications Medications  sodium chloride flush (NS) 0.9 % injection 10-40 mL (10 mLs Intracatheter Given 07/24/18 0502)  sodium chloride flush (NS) 0.9 % injection 10-40 mL (has no administration in time range)  cefTRIAXone (ROCEPHIN) 1 g in sodium chloride 0.9 % 100 mL IVPB (1 g Intravenous New Bag/Given 07/24/18 0504)  azithromycin (ZITHROMAX) 500 mg in sodium chloride 0.9 % 250 mL IVPB (has no administration in time range)  sodium chloride flush (NS) 0.9 % injection 3 mL (3 mLs Intravenous Given 07/24/18 0503)  sodium chloride 0.9 % bolus 1,000 mL (0 mLs Intravenous Stopped 07/24/18 0335)    Mobility non-ambulatory High fall risk   Focused Assessments   R Recommendations: See Admitting Provider Note  Report given to:   Additional Notes:

## 2018-07-24 NOTE — Progress Notes (Signed)
Pt had not voided since 6 am. No urge to void. Bladder scan shows 346 ml's. Four different in and out cath's attempted by multiple staff members unsuccessfully. Dr. Isidoro Donning paged and made aware. New order received to attempt in and out with coude tip. Upon insertion of coude, pt was able to void on her own. UA and UC collected and sent to lab. Dr. Isidoro Donning made aware. Will continue to monitor.

## 2018-07-24 NOTE — Evaluation (Signed)
Physical Therapy Evaluation Patient Details Name: Taylor Buttneratricia G. Pickney MRN: [id number] DOB: 01/29/1936 Today's Date: 07/24/2018   History of Present Illness  83 yo female admitted to ED from [facility] on 6/2 with FTT, increased confusion vs baseline. Pt with medical diagnosis of sepsis 2* HCAP. PMH includes dementia, HTN, vertigo.   Clinical Impression   Pt presents with generalized weakness, decreased activity tolerance, impaired sitting balance, and difficulty performing bed mobility. Pt to benefit from acute PT to address deficits. Pt required total assist +2 for bed mobility, and sat EOB ~5 minutes with periods of unsupported sitting without posterior LOB. Per pt, she walks at baseline. Unsure of the reliability of this information, but per chart review pt more cognitively intact at baseline. PT recommending SNF level of care upon d/c. PT to progress mobility as tolerated, and will continue to follow acutely.      Follow Up Recommendations SNF;Supervision/Assistance - 24 hour    Equipment Recommendations  None recommended by PT    Recommendations for Other Services       Precautions / Restrictions Precautions Precautions: Fall Restrictions Weight Bearing Restrictions: No      Mobility  Bed Mobility Overal bed mobility: Needs Assistance Bed Mobility: Supine to Sit;Sit to Supine     Supine to sit: Total assist;+2 for physical assistance;HOB elevated Sit to supine: Total assist;+2 for physical assistance;HOB elevated   General bed mobility comments: Pt participating 0% with assist to and from EOB, requires total assist for trunk elevation/lowering, LE management, scooting to and from EOB, and positioning in bed upon return to supine.   Transfers Overall transfer level: (NT)                  Ambulation/Gait Ambulation/Gait assistance: (NT)              Stairs            Wheelchair Mobility    Modified Rankin (Stroke Patients Only)       Balance  Overall balance assessment: Needs assistance Sitting-balance support: No upper extremity supported;Feet supported Sitting balance-Leahy Scale: Fair Sitting balance - Comments: able to sit EOB for a few seconds without PT support and without UE support, heavy posterior leaning when feet not on the ground. Pt sat EOB ~5 minutes during PT session.      Standing balance-Leahy Scale: (NT)                               Pertinent Vitals/Pain Pain Assessment: Faces Faces Pain Scale: Hurts little more Pain Location: RLE vs abdomen, hard to discern and pt unable to state Pain Descriptors / Indicators: Discomfort;Grimacing Pain Intervention(s): Monitored during session;Repositioned;Limited activity within patient's tolerance    Home Living Family/patient expects to be discharged to:: Assisted living([facility])               Home Equipment: Walker - 2 wheels      Prior Function Level of Independence: Needs assistance   Gait / Transfers Assistance Needed: Pt reports she walks with a RW, unsure of the accuracy of this information.   ADL's / Homemaking Assistance Needed: Pt states "I get help with everything". Pt is a limited historian at present, unsure of pt baseline.         Hand Dominance   Dominant Hand: (unsure)    Extremity/Trunk Assessment   Upper Extremity Assessment Upper Extremity Assessment: Generalized weakness    Lower Extremity Assessment Lower  Extremity Assessment: Generalized weakness;Difficult to assess due to impaired cognition(unable to get pt to participate in active ROM LEs, due to anxiety guarding vs dementia)    Cervical / Trunk Assessment Cervical / Trunk Assessment: Normal  Communication   Communication: No difficulties  Cognition Arousal/Alertness: Lethargic Behavior During Therapy: Anxious Overall Cognitive Status: History of cognitive impairments - at baseline Area of Impairment: Orientation;Memory;Following  commands;Safety/judgement;Problem solving                 Orientation Level: Disoriented to;Place;Time;Situation;Person(unable to state birthday, PT oriented pt to hospital)   Memory: Decreased short-term memory Following Commands: Follows one step commands inconsistently Safety/Judgement: Decreased awareness of safety   Problem Solving: Requires tactile cues;Requires verbal cues;Decreased initiation        General Comments General comments (skin integrity, edema, etc.): Pt with sacral and perineal pressure injuries, and is developing pressure sore on L heel. Prevalon boots applied in supine following PT eval.     Exercises     Assessment/Plan    PT Assessment Patient needs continued PT services  PT Problem List Decreased strength;Decreased mobility;Decreased safety awareness;Decreased range of motion;Decreased activity tolerance;Decreased cognition;Decreased balance;Pain       PT Treatment Interventions DME instruction;Functional mobility training;Balance training;Patient/family education;Therapeutic activities;Gait training;Therapeutic exercise    PT Goals (Current goals can be found in the Care Plan section)  Acute Rehab PT Goals PT Goal Formulation: Patient unable to participate in goal setting Time For Goal Achievement: 08/07/18 Potential to Achieve Goals: Good    Frequency Min 2X/week   Barriers to discharge        Co-evaluation               AM-PAC PT "6 Clicks" Mobility  Outcome Measure Help needed turning from your back to your side while in a flat bed without using bedrails?: Total Help needed moving from lying on your back to sitting on the side of a flat bed without using bedrails?: Total Help needed moving to and from a bed to a chair (including a wheelchair)?: Total Help needed standing up from a chair using your arms (e.g., wheelchair or bedside chair)?: Total Help needed to walk in hospital room?: Total Help needed climbing 3-5 steps with a  railing? : Total 6 Click Score: 6    End of Session   Activity Tolerance: Patient limited by fatigue;Patient limited by pain;Other (comment)(anxiety/tremors with mobility) Patient left: in bed;with bed alarm set Nurse Communication: Mobility status PT Visit Diagnosis: Other abnormalities of gait and mobility (R26.89);Muscle weakness (generalized) (M62.81)    Time: 8550-1586 PT Time Calculation (min) (ACUTE ONLY): 15 min   Charges:   PT Evaluation $PT Eval Low Complexity: 1 Low         Cheyane Ayon Terrial Rhodes, PT Acute Rehabilitation Services Pager [phone]  Office [phone]  Sindhu Nguyen D Lincoln Kleiner 07/24/2018, 2:13 PM

## 2018-07-24 NOTE — Progress Notes (Signed)
Initial Nutrition Assessment  RD working remotely.   DOCUMENTATION CODES:   (unable to assess for malnutrition at this time. )  INTERVENTION:  - diet advancement as medically feasible.    NUTRITION DIAGNOSIS:   Inadequate oral intake related to inability to eat as evidenced by NPO status.  GOAL:   Patient will meet greater than or equal to 90% of their needs  MONITOR:   Diet advancement, Labs, Weight trends, Skin  REASON FOR ASSESSMENT:   Malnutrition Screening Tool  ASSESSMENT:   83 year old female with history of dementia, HTN, and vertigo. She presented from [facility] nursing home d/t confusion. Patient has dementia and currently confused, unable to provide any history. Patient was sent to ED as she was not eating or taking her medications, FTT.  She was having increasing confusion beyond her baseline dementia. She was also noted to have low-grade fever of 100.1 F, tachypnea and mild tachycardia. COVID-19 negative in the ED.  Patient has been NPO since admission. RN flow sheet indicates that patient is a/o to self only. Per chart review, current weight is 149 lb and weight on 5/5 was 162 lb. This indicates 13 lb weight loss (8% body weight) in the past 1 month; significant for time frame. Notes indicate that patient is from a nursing home and that patient had decreased oral intake/refusing intakes PTA, but unsure of how long this was occurring.   WOC following patient.  Per notes: CXR showed R lung infiltrates, dx of HCAP or possibly could be aspiration PNA, sepsis, acute metabolic encephalopathy in addition to baseline dementia, AKI d/t dehydration, FTT.   Medications reviewed. Labs reviewed; Na: 146 mmol/l, Cl: 116 mmol/l, BUN: 49 mg/dl, creatinine: 1.6 mg/dl, AST elevated, GFR: 30 ml/min.  IVF; NS @ 75 ml/hr.     NUTRITION - FOCUSED PHYSICAL EXAM:  unable to complete at this time  Diet Order:   Diet Order    None      EDUCATION NEEDS:   Not appropriate  for education at this time  Skin:  Skin Assessment: Skin Integrity Issues: Skin Integrity Issues:: Stage I, DTI, Stage III DTI: L heel Stage I: R heel Stage III: sacrum  Last BM:  6/3  Height:   Ht Readings from Last 1 Encounters:  07/24/18 5\' 8"  (1.727 m)    Weight:   Wt Readings from Last 1 Encounters:  07/24/18 67.5 kg    Ideal Body Weight:  63.6 kg  BMI:  Body mass index is 22.63 kg/m.  Estimated Nutritional Needs:   Kcal:  2025-2230 kcal  Protein:  100-110 grams  Fluid:  >/= 2 L/day     Trenton Gammon, MS, RD, LDN, [facility] Inpatient Clinical Dietitian Pager # [phone] After hours/weekend pager # [phone]

## 2018-07-24 NOTE — ED Notes (Signed)
One set of blood cultures obtained from midline. Unable to obtain second set.

## 2018-07-24 NOTE — TOC Initial Note (Signed)
Transition of Care [facility](TOC) - Initial/Assessment Note    Patient Details  Name: Taylor Ruiz MRN: [id number] Date of Birth: 10/27/1935  Transition of Care [facility](TOC) CM/SW Contact:    Lanier ClamMahabir, Thresea Doble, RN Phone Number: 07/24/2018, 2:59 PM  Clinical Narrative: patient w/dementia. TC sister in law Taylor Ruiz [phone] [facility] on [address] Rebecca EatonFull code. TC [facility] rep Tanya-concerned about eating,code status. Noted Palliative cons-await recc.                  Expected Discharge Plan: Assisted Living Barriers to Discharge: Continued Medical Work up   Patient Goals and CMS Choice        Expected Discharge Plan and Services Expected Discharge Plan: Assisted Living   Discharge Planning Services: CM Consult Post Acute Care Choice: (ALF) Living arrangements for the past 2 months: Post-Acute Facility                                      Prior Living Arrangements/Services Living arrangements for the past 2 months: Post-Acute Facility   Patient language and need for interpreter reviewed:: Yes Do you feel safe going back to the place where you live?: Yes      Need for Family Participation in Patient Care: No (Comment) Care giver support system in place?: Yes (comment)   Criminal Activity/Legal Involvement Pertinent to Current Situation/Hospitalization: No - Comment as needed  Activities of Daily Living Home Assistive Devices/Equipment: None ADL Screening (condition at time of admission) Patient's cognitive ability adequate to safely complete daily activities?: No Is the patient deaf or have difficulty hearing?: No Does the patient have difficulty seeing, even when wearing glasses/contacts?: No Does the patient have difficulty concentrating, remembering, or making decisions?: Yes Patient able to express need for assistance with ADLs?: No Does the patient have difficulty dressing or bathing?: Yes Independently performs ADLs?: No Communication:  Independent Dressing (OT): Dependent Is this a change from baseline?: Pre-admission baseline Grooming: Dependent Is this a change from baseline?: Pre-admission baseline Feeding: Dependent Is this a change from baseline?: Pre-admission baseline Bathing: Dependent Is this a change from baseline?: Pre-admission baseline Toileting: Dependent Is this a change from baseline?: Pre-admission baseline In/Out Bed: Dependent Is this a change from baseline?: Pre-admission baseline Walks in Home: Dependent Is this a change from baseline?: Pre-admission baseline Does the patient have difficulty walking or climbing stairs?: Yes Weakness of Legs: Both Weakness of Arms/Hands: Both  Permission Sought/Granted Permission sought to share information with : Case Manager    Share Information with NAME: Taylor Ruiz  Permission granted to share info w AGENCY: [facility]   Permission granted to share info w Relationship: sister in law  Permission granted to share info w Contact Information: [phone]  Emotional Assessment Appearance:: Appears stated age Attitude/Demeanor/Rapport: Gracious Affect (typically observed): Accepting Orientation: : Oriented to Self Alcohol / Substance Use: Never Used    Admission diagnosis:  Dehydration [E86.0] AKI (acute kidney injury) (HCC) [N17.9] HCAP (healthcare-associated pneumonia) [J18.9] Sepsis (HCC) [A41.9] Patient Active Problem List   Diagnosis Date Noted  . Pneumonia 07/24/2018  . Pressure injury of skin 07/24/2018  . HCAP (healthcare-associated pneumonia) 07/24/2018  . AKI (acute kidney injury) (HCC) 07/24/2018  . FTT (failure to thrive) in adult 07/24/2018  . HTN (hypertension) 07/24/2018  . Acute metabolic encephalopathy 07/24/2018  . Syncope 06/24/2018  . Dementia (HCC) 06/24/2018  . Mild renal insufficiency 06/24/2018  .  Hypokalemia 06/24/2018  . Elevated troponin 06/24/2018   PCP:  Ralene Ok, MD Pharmacy:  No Pharmacies  Listed    Social Determinants of Health (SDOH) Interventions    Readmission Risk Interventions No flowsheet data found.

## 2018-07-24 NOTE — Evaluation (Signed)
Clinical/Bedside Swallow Evaluation Patient Details  Name: Taylor Ruiz MRN: [id number] Date of Birth: [birth year]  Today's Date: 07/24/2018 Time: SLP Start Time (ACUTE ONLY): 1705 SLP Stop Time (ACUTE ONLY): 1725 SLP Time Calculation (min) (ACUTE ONLY): 20 min  Past Medical History:  Past Medical History:  Diagnosis Date  . Dementia (HCC)   . HTN (hypertension)   . Vertigo    Past Surgical History: History reviewed. No pertinent surgical history. HPI:  83 yo female from [facility] SN[facility] admitted with FTT, poor intake, AMS, increased confusion.  Pt found to have RLL hazy density, infiltrate concerning for developing pna per CXR 07/24/2018.  She has h/o HTN, vertigo, pressure injury to skin.  Admitted with tachycardia, tachypenia.  Swallow eval ordered.      Assessment / Plan / Recommendation Clinical Impression  Patient has primary oral dysphagia secondary to her dementia.  This results in her demonstrating oral holding and delayed transiting.  Possible pharyngeal swallow delay also.  She is edentulous and does not allow SlP to view oral cavity adequately. Question small amount of coating on right lateral tongue.  No overt indication of aspiration with po of water, Ensure, nor icecream.   Pt did however demonstrate slightly increased RR when excessively orally holding bolus.   She may benefit from oral suction for use if pt is excessively orally holding to decrease aspiration risk.    Pt's swallow is extremely delayed at times requiring dry spoon or icecream *increased thermal stimulation* to elicit swallow of liquids.  She will be elevated aspiration risk due to her dementia, bedridden status and dysphagia. To maximize swallow efficiency and safety, recommend to start with full liquid diet.    Will follow up for dysphagia management.  Posted detailed swallow precaution signs in the room.   SLP Visit Diagnosis: Dysphagia, oral phase (R13.11)    Aspiration Risk  Moderate aspiration risk     Diet Recommendation Thin liquid;Other (Comment)(full liquid)   Liquid Administration via: Spoon;Cup;Straw Medication Administration: Crushed with puree Supervision: Staff to assist with self feeding(have pt help  hold cup to elicit improved swallow function) Compensations: Minimize environmental distractions;Slow rate;Small sips/bites;Other (Comment)(use dry spoon or cold item to elicit swallow if oral holding, assure pt swallows before giving more po) Postural Changes: Seated upright at 90 degrees;Remain upright for at least 30 minutes after po intake    Other  Recommendations Oral Care Recommendations: Oral care BID   Follow up Recommendations Skilled Nursing facility      Frequency and Duration min 1 x/week  2 weeks       Prognosis Prognosis for Safe Diet Advancement: Fair Barriers to Reach Goals: Cognitive deficits      Swallow Study   General Date of Onset: 07/24/18 HPI: 83 yo female from [place] [facility] admitted with FTT, poor intake, AMS, increased confusion.  Pt found to have RLL hazy density, infiltrate concerning for developing pna per CXR 07/24/2018.  She has h/o HTN, vertigo, pressure injury to skin.  Admitted with tachycardia, tachypenia.  Swallow eval ordered.    Type of Study: Bedside Swallow Evaluation Diet Prior to this Study: NPO Temperature Spikes Noted: Yes Respiratory Status: Room air History of Recent Intubation: No Behavior/Cognition: Alert;Doesn't follow directions Oral Cavity Assessment: Other (comment)(difficult to view, ? mild amount of white-blue coating on right lateral tongue, pt did not open mouth adequately) Oral Care Completed by SLP: Other (Comment)(attempted, pt resistant) Oral Cavity - Dentition: Missing dentition Self-Feeding Abilities: Needs assist(pt able to help hold cup in  right hand, hand over hand assist) Patient Positioning: Upright in bed Baseline Vocal Quality: Normal Volitional Cough: Cognitively unable to elicit Volitional Swallow:  Unable to elicit    Oral/Motor/Sensory Function Overall Oral Motor/Sensory Function: Generalized oral weakness(no focal CN deficits from oral motor exam, able to seal lips on straw, no facial asymmetry)   Ice Chips Ice chips: Not tested   Thin Liquid Thin Liquid: Impaired Presentation: Straw(tip of straw) Oral Phase Impairments: Reduced lingual movement/coordination Oral Phase Functional Implications: Prolonged oral transit Pharyngeal  Phase Impairments: Suspected delayed Swallow    Nectar Thick Nectar Thick Liquid: Impaired Presentation: Straw(tip of straw) Oral Phase Impairments: Reduced lingual movement/coordination Oral phase functional implications: Prolonged oral transit Pharyngeal Phase Impairments: Suspected delayed Swallow   Honey Thick Honey Thick Liquid: Not tested   Puree Puree: Impaired(icecream) Presentation: Spoon Oral Phase Impairments: Reduced lingual movement/coordination Oral Phase Functional Implications: Prolonged oral transit Pharyngeal Phase Impairments: Suspected delayed Swallow   Solid     Solid: Not tested Other Comments: pt barely opening her mouth to articulate thus did not provide solids      Taylor Ruiz, Taylor Ruiz 07/24/2018,5:37 PM  Donavan Burnetamara Ginelle Bays, MS [facility] SLP Acute Rehab Services Pager [phone] Office [phone]

## 2018-07-24 NOTE — Progress Notes (Signed)
Pharmacy Antibiotic Note  Taylor Ruiz is a 83 y.o. female admitted on 07/23/2018 with pneumonia.  Pharmacy has been consulted for vancomycin dosing. MRSA PCR negative this morning.   Plan: Vancomycin 1500 mg IV x 1 dose today then DC per MD verbal orders- will cover for at least 48 -72 hrs with current renal function.  Height: 5\' 8"  (172.7 cm) Weight: 148 lb 13 oz (67.5 kg) IBW/kg (Calculated) : 63.9  Temp (24hrs), Avg:99.4 F (37.4 C), Min:98.7 F (37.1 C), Max:100.1 F (37.8 C)  Recent Labs  Lab 07/23/18 2314 07/24/18 0111  WBC  --  20.5*  CREATININE  --  1.60*  LATICACIDVEN 1.9  --     Estimated Creatinine Clearance: 28.3 mL/min (A) (by C-G formula based on SCr of 1.6 mg/dL (H)).    No Known Allergies   Thank you for allowing pharmacy to be a part of this patient's care. Herby Abraham, Pharm.D [phone] 07/24/2018 8:38 AM

## 2018-07-24 NOTE — Progress Notes (Signed)
PHARMACY NOTE:  ANTIMICROBIAL RENAL DOSAGE ADJUSTMENT  Current antimicrobial regimen includes a mismatch between antimicrobial dosage and estimated renal function.  As per policy approved by the Pharmacy & Therapeutics and Medical Executive Committees, the antimicrobial dosage will be adjusted accordingly.  Current antimicrobial dosage:  Cefepime 1gm IV q8h x 8days  Indication: PNA  Renal Function:  Estimated Creatinine Clearance: 28.3 mL/min (A) (by C-G formula based on SCr of 1.6 mg/dL (H)).    Antimicrobial dosage has been changed to:  Cefepime 2gm IV q24h x 8 days  Thank you for allowing pharmacy to be a part of this patient's care.  Maryellen Pile, [facility] 07/24/2018 8:05 AM

## 2018-07-24 NOTE — Evaluation (Signed)
SLP Cancellation Note  Patient Details Name: Taylor Ruiz MRN: [id number] DOB: 14-May-1935   Cancelled treatment:       Reason Eval/Treat Not Completed: Other (comment)(RN reports pt not adequately alert for swallow evaluation)  Donavan Burnet, MS [facility] SLP Acute Rehab Services Pager [phone] Office [phone]  Chales Abrahams 07/24/2018, 11:13 AM

## 2018-07-24 NOTE — Consult Note (Signed)
WOC Nurse wound consult note Reason for Consult:Moisture associated skin damage to coccyx/buttocks and perineal skin.  Deep tissue injury to left heel, intact.   Wound type:pressure and moisture.   Female external catheter in place to divert incontinence away from skin.  Pressure Injury POA: Yes heels Measurement: Left heel:  1 cm round, maroon intact discoloration Nonblanchable erythema to right heel, resolving but remains at risk for further breakdown.  Scattered pink weeping skin to coccyx and perineal skin from likely exposure to urinary incontinence Wound PFX:TKWI and moist Drainage (amount, consistency, odor) scant weeping MASD  Periwound:intact Dressing procedure/placement/frequency:Cleanse perineal and buttock skin with soap and water and pat dry.  Apply Gerhardts butt paste.  Keep skin clean and dry.  No disposable briefs or underpads.  Prevalon boots while in bed.  Will not follow at this time.  Please re-consult if needed.  Maple Hudson MSN, RN, FNP-BC CWON Wound, Ostomy, Continence Nurse Pager [phone]

## 2018-07-24 NOTE — H&P (Signed)
History and Physical        Hospital Admission Note Date: 07/24/2018  Patient name: Taylor Ruiz Medical record number: [id number] Date of birth: 06/16/1935 Age: 83 y.o. Gender: female  PCP: Jilda Panda, MD    Patient coming from: [place]  I have reviewed all records in the [facility].    Chief Complaint:  Confused  HPI: Patient is a 83 year old female with history of dementia, hypertension, vertigo presented from [facility] due to confusion.  Patient has dementia and currently confused, unable to provide any history.  History is based on ER records.  Patient was sent to ED as she was not eating or taking her medications, failure to thrive.  She was having increasing confusion beyond her baseline dementia.  She was also noted to have low-grade fever of 100.1 F, tachypnea and mild tachycardia. COVID 19 negative  ED work-up/course:   In ED, patient was noted to have temp of 100.1 F respiratory rate 35, pulse 108, BP 117/71, O2 sats 100% on room air CBC showed WBC count of 20.5, hemoglobin 10.9 Sodium 146, BUN 49, creatinine 1.6, baseline creatinine 0.8.  Sodium 146.  Chest x-ray showed right lung infiltrates  Review of Systems: Positives marked in 'bold' Unable to obtain review of system from the patient due to altered mental status, dementia  Past Medical History: Past Medical History:  Diagnosis Date  . Dementia ([place])   . HTN (hypertension)   . Vertigo    Past surgical history Unable to obtain from the patient due to her mental status and dementia  Medications: Prior to Admission medications   Medication Sig Start Date End Date Taking? Authorizing Provider  acetaminophen (TYLENOL) 325 MG tablet Take 650 mg by mouth every 6 (six) hours as needed for mild pain, moderate pain, fever or headache.   Yes [provider]  amLODipine (NORVASC) 10 MG tablet Take 10 mg by mouth daily.   Yes [provider]  Brexpiprazole (REXULTI) 0.5 MG TABS Take 1 mg by mouth at bedtime.   Yes [provider]  donepezil (ARICEPT) 5 MG tablet Take 5 mg by mouth at bedtime.   Yes [provider]  meclizine (ANTIVERT) 25 MG tablet Take 25 mg by mouth 2 (two) times daily.   Yes [provider]  metoprolol tartrate (LOPRESSOR) 25 MG tablet Take 1 tablet (25 mg total) by mouth 2 (two) times daily. 06/25/18  Yes Oretha Milch D, MD  pregabalin (LYRICA) 50 MG capsule Take 50 mg by mouth at bedtime.   Yes [provider]  valACYclovir (VALTREX) 500 MG tablet Take 500 mg by mouth 2 (two) times daily.   Yes [provider]    Allergies:  No Known Allergies  Social History: Per records, unable to provide history herself.  Reported that she has never smoked. She has never used smokeless tobacco. No history on file for alcohol and drug.  Family History: Unable to obtain family history from the patient due to her mental status  Physical Exam: Blood pressure 117/69, pulse 100, temperature 98.7 F (37.1 C), temperature source Oral, resp. rate (!) 24, height 5' 8" (1.727  m), weight 67.5 kg, SpO2 95 %. General: Alert, awake, confused, mumbling incoherent words Eyes: pink conjunctiva,anicteric sclera, pupils equal and reactive to light and accomodation, HEENT: normocephalic, atraumatic, oropharynx clear Neck: supple, no masses or lymphadenopathy, no goiter, no bruits, no JVD CVS: Regular rate and rhythm, without murmurs, rubs or gallops. No lower extremity edema Resp : Decreased breath sound at the bases, right> left GI : Soft, nontender, nondistended, positive bowel sounds, no masses. No hepatomegaly. No hernia.  Musculoskeletal: No clubbing or cyanosis, positive pedal pulses. No contracture. ROM intact  Neuro: Does not follow commands Psych: Confused Skin: no rashes or lesions,  warm and dry   LABS on Admission: I have personally reviewed all the labs and imagings below    Basic Metabolic Panel: Recent Labs  Lab 07/24/18 0111  NA 146*  K 3.7  CL 116*  CO2 20*  GLUCOSE 108*  BUN 49*  CREATININE 1.60*  CALCIUM 9.3   Liver Function Tests: Recent Labs  Lab 07/24/18 0111  AST 56*  ALT 33  ALKPHOS 71  BILITOT 0.7  PROT 8.5*  ALBUMIN 2.9*   No results for input(s): LIPASE, AMYLASE in the last 168 hours. No results for input(s): AMMONIA in the last 168 hours. CBC: Recent Labs  Lab 07/24/18 0111  WBC 20.5*  NEUTROABS 17.3*  HGB 10.9*  HCT 34.8*  MCV 96.1  PLT 209   Cardiac Enzymes: No results for input(s): CKTOTAL, CKMB, CKMBINDEX, TROPONINI in the last 168 hours. BNP: Invalid input(s): POCBNP CBG: No results for input(s): GLUCAP in the last 168 hours.  Radiological Exams on Admission:  Dg Chest Port 1 View  Result Date: 07/24/2018 CLINICAL DATA:  83 year old female with sepsis. EXAM: PORTABLE CHEST 1 VIEW COMPARISON:  Earlier radiograph dated 07/24/2018 FINDINGS: There is a background of emphysema and chronic bronchitic changes. An area of diffuse hazy density in the right lower lung field appears new since the radiograph of 06/24/2018 and concerning for developing infiltrate. There is no pleural effusion or pneumothorax. The cardiac silhouette is within normal limits. No acute osseous pathology. IMPRESSION: Right lower lung field hazy density concerning for developing infiltrate. Clinical correlation and follow-up recommended. Electronically Signed   By: Anner Crete M.D.   On: 07/24/2018 04:06   Dg Chest Port 1 View  Result Date: 07/24/2018 CLINICAL DATA:  Sepsis EXAM: PORTABLE CHEST 1 VIEW COMPARISON:  06/24/2018 FINDINGS: There is a diffuse hazy appearance of the right lower lung field. There is blunting of the right costophrenic angle, however this is not well visualized secondary to overlapping wires. The lung apices are suboptimally  evaluated secondary to overlapping structures. There is no acute osseous abnormality. No detection of a pneumothorax. IMPRESSION: Findings concerning for developing right lower lobe pneumonia, however evaluation is limited by suboptimal patient positioning. Consider a short interval repeat chest x-ray for further evaluation. Electronically Signed   By: Constance Holster M.D.   On: 07/24/2018 02:02      EKG: Independently reviewed. *Rate 84, normal sinus rhythm**   Assessment/Plan Principal Problem:   HCAP (healthcare-associated pneumonia)/right lung pneumonia with sepsis -Possibly could be aspiration pneumonia, patient from skilled nursing facility, currently confused -Patient met sepsis criteria at the time of admission due to fever, leukocytosis, tachycardia, tachypnea, source likely due to pneumonia -Given HCAP, placed on vancomycin, cefepime, obtain MRSA PCR, narrow antibiotics in next 24 hours -Obtain urine Legionella antigen, urine strep antigen, placed on IV fluids -Obtain SLP evaluation once more alert and able to complete the  test to rule out aspiration  Active Problems: Acute metabolic encephalopathy on dementia [facility]) -Patient has underlying dementia and at baseline with confusion, likely worsened due to acute kidney injury and H CAP, dehydration -Continue IV fluid hydration, IV antibiotics, hold Lyrica    pressure injury of skin -Pressure injury on coccyx and perineal area, deep tissue injury to left heel -Wound care consult placed    AKI (acute kidney injury) ([place]) -Due to dehydration, patient has not been eating or drinking for the last few days per skilled nursing facility -Hold Lyrica, continue IV fluid hydration    FTT (failure to thrive) in adult -Patient will likely benefit from palliative care consult regarding goals of care,code status, has advanced dementia, likely has underlying aspiration, not eating or drinking well    HTN (hypertension) -Continue  Lopressor  GERD -Continue PPI   DVT prophylaxis: Lovenox  CODE STATUS: Full code   Consults called: none   Family Communication:   Admission status: Inpatient, telemetry  Disposition plan: Further plan will depend as patient's clinical course evolves and further radiologic and laboratory data become available.    At the time of admission, it appears that the appropriate admission status for this patient is INPATIENT . This is judged to be reasonable and necessary in order to provide the required intensity of service to ensure the patient's safety given the presenting symptoms failure to thrive, right lung pneumonia, acute kidney injury, physical exam findings, and initial radiographic and laboratory data in the context of their chronic comorbidities.  The medical decision making on this patient was of high complexity and the patient is at high risk for clinical deterioration, therefore this is a level 3 visit.   Time Spent on Admission: 60 minutes    Ripudeep Rai M.D. [facility] 07/24/2018, 9:15 AM

## 2018-07-25 LAB — CBC
HCT: 28.2 % — ABNORMAL LOW (ref 36.0–46.0)
Hemoglobin: 8.9 g/dL — ABNORMAL LOW (ref 12.0–15.0)
MCH: 30.3 pg (ref 26.0–34.0)
MCHC: 31.6 g/dL (ref 30.0–36.0)
MCV: 95.9 fL (ref 80.0–100.0)
Platelets: 208 10*3/uL (ref 150–400)
RBC: 2.94 MIL/uL — ABNORMAL LOW (ref 3.87–5.11)
RDW: 15.6 % — ABNORMAL HIGH (ref 11.5–15.5)
WBC: 14.6 10*3/uL — ABNORMAL HIGH (ref 4.0–10.5)
nRBC: 0 % (ref 0.0–0.2)

## 2018-07-25 LAB — BASIC METABOLIC PANEL
Anion gap: 7 (ref 5–15)
BUN: 36 mg/dL — ABNORMAL HIGH (ref 8–23)
CO2: 16 mmol/L — ABNORMAL LOW (ref 22–32)
Calcium: 8.3 mg/dL — ABNORMAL LOW (ref 8.9–10.3)
Chloride: 128 mmol/L — ABNORMAL HIGH (ref 98–111)
Creatinine, Ser: 1.07 mg/dL — ABNORMAL HIGH (ref 0.44–1.00)
GFR calc Af Amer: 57 mL/min — ABNORMAL LOW (ref >60)
GFR calc non Af Amer: 49 mL/min — ABNORMAL LOW (ref >60)
Glucose, Bld: 85 mg/dL (ref 70–99)
Potassium: 3.3 mmol/L — ABNORMAL LOW (ref 3.5–5.1)
Sodium: 151 mmol/L — ABNORMAL HIGH (ref 135–145)

## 2018-07-25 LAB — HIV ANTIBODY (ROUTINE TESTING W REFLEX): HIV Screen 4th Generation wRfx: NONREACTIVE

## 2018-07-25 MED ORDER — POTASSIUM CHLORIDE 20 MEQ PO PACK
40.0000 meq | PACK | Freq: Once | ORAL | Status: AC
  Administered 2018-07-25: 40 meq via ORAL
  Filled 2018-07-25: qty 2

## 2018-07-25 MED ORDER — SODIUM CHLORIDE 0.45 % IV SOLN
INTRAVENOUS | Status: DC
  Administered 2018-07-25: 10:00:00 via INTRAVENOUS

## 2018-07-25 NOTE — Progress Notes (Signed)
PROGRESS NOTE    Taylor Ruiz  RUE:[id number] DOB: Sep 01, 1935 DOA: 07/23/2018 PCP: Ralene Ok, MD   Brief Narrative: Patient is 83 year old female with history of dementia, hypertension, vertigo who presents from [facility] assisted living facility due to confusion.  Patient has been dementia on her baseline and she was confused, unable to provide history on presentation.  She was not eating or taking her medications in the assisted living facility so she was sent here.  As per the report she was having increasing confusion beyond her baseline dementia.  Also noted to have low-grade fever of 101 F Fever, tachypnea and mild tachycardia. Covid-19 was negative.  Chest x-ray done in the emergency department showed right lower lobe haziness suggesting pneumonia.  Assessment & Plan:   Principal Problem:   HCAP (healthcare-associated pneumonia) Active Problems:   Dementia ([facility])   Pressure injury of skin   AKI (acute kidney injury) ([facility])   FTT (failure to thrive) in adult   HTN (hypertension)   Acute metabolic encephalopathy  Healthcare associated pneumonia: Chest x-ray showed right lower lobe suggesting pneumonia.  Possible aspiration pneumonia.  She has been started on vancomycin and cefepime to cover for healthcare associated pneumonia.  Speech therapy has been consulted.  Currently on full liquid diet.  Currently she is afebrile and hemodynamically stable.  Continue gentle IV fluids.  Also has mild leukocytosis.  Acute metabolic encephalopathy: Due to current metabolic derangements on dementia from her baseline.  This morning she was nonverbal, hardly opening her eyes.  Continue supportive care.  Lyrica held.  Acute kidney injury: Resolved with IV fluids.  Continue to monitor  Hyponatremia, hypokalemia: Start on half normal saline.  Potassium supplemented.  Failure to thrive: Nutrition following.  Hypertension: Current blood pressure stable.  Continue Lopressor  Pressure injury of  the skin: Procedure in the coccyx and perineal area, deep tissue injury to left heel.  Wound care consulted.  GERD: Continue PPI  Chronic normocytic anemia: Currently H&H stable.  We will continue to monitor.  Anemia still with chronic medical problems, malnutrition  Possible urinary tract infection: UA was suggestive of urinary tract infection.  We will follow-up urine culture,blood culture.  Continue antibiotics  Debility/deconditioning: Patient seen by physical therapist and recommended skilled nursing facility on discharge.  She is from ALF Patient also seen by palliative care medicine.  Remains full code for now.Plan is to discuss with family today.  Pressure injury of skin:Pressure injury on coccyx and perineal area, deep tissue injury to left heel.Wound care consult placed    Nutrition Problem: Inadequate oral intake Etiology: inability to eat      DVT prophylaxis:Lovenox Code Status: Full Family Communication: None present at the bedside Disposition Plan: Skilled nursing facility when medically stable   Consultants: None  Procedures:None  Antimicrobials:  Anti-infectives (From admission, onward)   Start     Dose/Rate Route Frequency Ordered Stop   07/24/18 1000  valACYclovir (VALTREX) tablet 500 mg     500 mg Oral 2 times daily 07/24/18 0745     07/24/18 0900  ceFEPIme (MAXIPIME) 2 g in sodium chloride 0.9 % 100 mL IVPB     2 g 200 mL/hr over 30 Minutes Intravenous Every 24 hours 07/24/18 0805 08/01/18 0859   07/24/18 0830  vancomycin (VANCOCIN) 1,500 mg in sodium chloride 0.9 % 500 mL IVPB     1,500 mg 250 mL/hr over 120 Minutes Intravenous  Once 07/24/18 0802 07/24/18 1221   07/24/18 0800  ceFEPIme (MAXIPIME) 1 g  in sodium chloride 0.9 % 100 mL IVPB  Status:  Discontinued     1 g 200 mL/hr over 30 Minutes Intravenous Every 8 hours 07/24/18 0745 07/24/18 0804   07/24/18 0345  cefTRIAXone (ROCEPHIN) 1 g in sodium chloride 0.9 % 100 mL IVPB     1 g 200 mL/hr over  30 Minutes Intravenous  Once 07/24/18 0333 07/24/18 0534   07/24/18 0345  azithromycin (ZITHROMAX) 500 mg in sodium chloride 0.9 % 250 mL IVPB     500 mg 250 mL/hr over 60 Minutes Intravenous  Once 07/24/18 1610 07/24/18 9604      Subjective: Patient seen and examined at the bedside this morning.  Remains nonverbal, confused, does not obey any commands.  Looks comfortable and is not in any of respiratory distress  Objective: Vitals:   07/24/18 1036 07/24/18 1415 07/24/18 2143 07/25/18 0554  BP:  117/60 113/67 124/72  Pulse:  90 98 93  Resp: 20 (!) 25 (!) 24 (!) 25  Temp:  98.7 F (37.1 C) 99.7 F (37.6 C) 98.4 F (36.9 C)  TempSrc:   Axillary Oral  SpO2:  93% 96% 92%  Weight:      Height:        Intake/Output Summary (Last 24 hours) at 07/25/2018 1135 Last data filed at 07/25/2018 0400 Gross per 24 hour  Intake 1881.28 ml  Output -  Net 1881.28 ml   Filed Weights   07/24/18 0602  Weight: 67.5 kg    Examination:  General exam: debilitated elderly female HEENT:PERRL,Oral mucosa moist, Ear/Nose normal on gross exam Respiratory system: Decreased air entry on the bases,no wheezes or crackles  Cardiovascular system: S1 & S2 heard, RRR. No JVD, murmurs, rubs, gallops or clicks. No pedal edema. Gastrointestinal system: Abdomen is nondistended, soft and nontender. No organomegaly or masses felt. Normal bowel sounds heard. Central nervous system:Not  Alert and oriented. No focal neurological deficits. Extremities: No edema, no clubbing ,no cyanosis, distal peripheral pulses palpable.  Data Reviewed: I have personally reviewed following labs and imaging studies  CBC: Recent Labs  Lab 07/24/18 0111 07/25/18 0409  WBC 20.5* 14.6*  NEUTROABS 17.3*  --   HGB 10.9* 8.9*  HCT 34.8* 28.2*  MCV 96.1 95.9  PLT 209 208   Basic Metabolic Panel: Recent Labs  Lab 07/24/18 0111 07/25/18 0409  NA 146* 151*  K 3.7 3.3*  CL 116* 128*  CO2 20* 16*  GLUCOSE 108* 85  BUN 49* 36*   CREATININE 1.60* 1.07*  CALCIUM 9.3 8.3*   GFR: Estimated Creatinine Clearance: 42.3 mL/min (A) (by C-G formula based on SCr of 1.07 mg/dL (H)). Liver Function Tests: Recent Labs  Lab 07/24/18 0111  AST 56*  ALT 33  ALKPHOS 71  BILITOT 0.7  PROT 8.5*  ALBUMIN 2.9*   No results for input(s): LIPASE, AMYLASE in the last 168 hours. No results for input(s): AMMONIA in the last 168 hours. Coagulation Profile: Recent Labs  Lab 07/24/18 0111  INR 1.1   Cardiac Enzymes: No results for input(s): CKTOTAL, CKMB, CKMBINDEX, TROPONINI in the last 168 hours. BNP (last 3 results) No results for input(s): PROBNP in the last 8760 hours. HbA1C: No results for input(s): HGBA1C in the last 72 hours. CBG: No results for input(s): GLUCAP in the last 168 hours. Lipid Profile: No results for input(s): CHOL, HDL, LDLCALC, TRIG, CHOLHDL, LDLDIRECT in the last 72 hours. Thyroid Function Tests: No results for input(s): TSH, T4TOTAL, FREET4, T3FREE, THYROIDAB in the last 72  hours. Anemia Panel: No results for input(s): VITAMINB12, FOLATE, FERRITIN, TIBC, IRON, RETICCTPCT in the last 72 hours. Sepsis Labs: Recent Labs  Lab 07/23/18 2314  LATICACIDVEN 1.9    Recent Results (from the past 240 hour(s))  Culture, blood (Routine x 2)     Status: None (Preliminary result)   Collection Time: 07/24/18 12:59 AM  Result Value Ref Range Status   Specimen Description   Final    BLOOD LEFT ANTECUBITAL Performed at [facility], [address], [place], [place] [address]    Special Requests   Final    BOTTLES DRAWN AEROBIC AND ANAEROBIC Blood Culture adequate volume Performed at [facility], [address] [place], [place] [address]    Culture   Final    NO GROWTH 1 DAY Performed at [facility], [address] [place], [place] [address]    Report Status PENDING  Incomplete  SARS Coronavirus 2 (CEPHEID- Performed in [facility] hospital lab), Hosp  Order     Status: None   Collection Time: 07/24/18 12:59 AM  Result Value Ref Range Status   SARS Coronavirus 2 NEGATIVE NEGATIVE Final    Comment: (NOTE) If result is NEGATIVE SARS-CoV-2 target nucleic acids are NOT DETECTED. The SARS-CoV-2 RNA is generally detectable in upper and lower  respiratory specimens during the acute phase of infection. The lowest  concentration of SARS-CoV-2 viral copies this assay can detect is 250  copies / mL. A negative result does not preclude SARS-CoV-2 infection  and should not be used as the sole basis for treatment or other  patient management decisions.  A negative result may occur with  improper specimen collection / handling, submission of specimen other  than nasopharyngeal swab, presence of viral mutation(s) within the  areas targeted by this assay, and inadequate number of viral copies  (<250 copies / mL). A negative result must be combined with clinical  observations, patient history, and epidemiological information. If result is POSITIVE SARS-CoV-2 target nucleic acids are DETECTED. The SARS-CoV-2 RNA is generally detectable in upper and lower  respiratory specimens dur ing the acute phase of infection.  Positive  results are indicative of active infection with SARS-CoV-2.  Clinical  correlation with patient history and other diagnostic information is  necessary to determine patient infection status.  Positive results do  not rule out bacterial infection or co-infection with other viruses. If result is PRESUMPTIVE POSTIVE SARS-CoV-2 nucleic acids MAY BE PRESENT.   A presumptive positive result was obtained on the submitted specimen  and confirmed on repeat testing.  While 2019 novel coronavirus  (SARS-CoV-2) nucleic acids may be present in the submitted sample  additional confirmatory testing may be necessary for epidemiological  and / or clinical management purposes  to differentiate between  SARS-CoV-2 and other Sarbecovirus currently  known to infect humans.  If clinically indicated additional testing with an alternate test  methodology [phone]) is advised. The SARS-CoV-2 RNA is generally  detectable in upper and lower respiratory sp ecimens during the acute  phase of infection. The expected result is Negative. Fact Sheet for Patients:  BoilerBrush.com.cy Fact Sheet for Healthcare Providers: https://pope.com/ This test is not yet approved or cleared by the [place] FDA and has been authorized for detection and/or diagnosis of SARS-CoV-2 by FDA under an Emergency Use Authorization (EUA).  This EUA will remain in effect (meaning this test can be used) for the duration of the COVID-19 declaration under Section 564(b)(1) of the Act,  21 U.S.C. section 360bbb-3(b)(1), unless the authorization is terminated or revoked sooner. Performed at [facility], [address] [place], [place] [address]   MRSA PCR Screening     Status: None   Collection Time: 07/24/18  6:34 AM  Result Value Ref Range Status   MRSA by PCR NEGATIVE NEGATIVE Final    Comment:        The GeneXpert MRSA Assay (FDA approved for NASAL specimens only), is one component of a comprehensive MRSA colonization surveillance program. It is not intended to diagnose MRSA infection nor to guide or monitor treatment for MRSA infections. Performed at [facility], [address], [place], [place] [address]   Culture, blood (routine x 2) Call MD if unable to obtain prior to antibiotics being given     Status: None (Preliminary result)   Collection Time: 07/24/18 10:20 AM  Result Value Ref Range Status   Specimen Description   Final    BLOOD BLOOD RIGHT HAND Performed at [facility], [address], [place], [place] [address]    Special Requests   Final    BOTTLES DRAWN AEROBIC ONLY Blood Culture results may not be optimal due to an inadequate  volume of blood received in culture bottles Performed at [facility] [address], [place], [place] [address]    Culture   Final    NO GROWTH < 24 HOURS Performed at [facility], [address], [place], [place] [address]    Report Status PENDING  Incomplete         Radiology Studies: Dg Chest Port 1 View  Result Date: 07/24/2018 CLINICAL DATA:  83 year old female with sepsis. EXAM: PORTABLE CHEST 1 VIEW COMPARISON:  Earlier radiograph dated 07/24/2018 FINDINGS: There is a background of emphysema and chronic bronchitic changes. An area of diffuse hazy density in the right lower lung field appears new since the radiograph of 06/24/2018 and concerning for developing infiltrate. There is no pleural effusion or pneumothorax. The cardiac silhouette is within normal limits. No acute osseous pathology. IMPRESSION: Right lower lung field hazy density concerning for developing infiltrate. Clinical correlation and follow-up recommended. Electronically Signed   By: Elgie Collard M.D.   On: 07/24/2018 04:06   Dg Chest Port 1 View  Result Date: 07/24/2018 CLINICAL DATA:  Sepsis EXAM: PORTABLE CHEST 1 VIEW COMPARISON:  06/24/2018 FINDINGS: There is a diffuse hazy appearance of the right lower lung field. There is blunting of the right costophrenic angle, however this is not well visualized secondary to overlapping wires. The lung apices are suboptimally evaluated secondary to overlapping structures. There is no acute osseous abnormality. No detection of a pneumothorax. IMPRESSION: Findings concerning for developing right lower lobe pneumonia, however evaluation is limited by suboptimal patient positioning. Consider a short interval repeat chest x-ray for further evaluation. Electronically Signed   By: Katherine Mantle M.D.   On: 07/24/2018 02:02        Scheduled Meds: . Brexpiprazole  1 mg Oral QHS  . donepezil  5 mg Oral QHS  . enoxaparin (LOVENOX) injection  30 mg  Subcutaneous Q24H  . feeding supplement (ENSURE ENLIVE)  237 mL Oral BID BM  . Gerhardt's butt cream   Topical BID  . meclizine  25 mg Oral BID  . mouth rinse  15 mL Mouth Rinse BID  . metoprolol tartrate  25 mg Oral BID  . sodium chloride flush  10-40 mL Intracatheter Q12H  . valACYclovir  500 mg Oral BID   Continuous Infusions: . sodium chloride 75 mL/hr at 07/25/18 0945  . ceFEPime (MAXIPIME) IV 2 g (07/25/18 0947)     LOS: 1 day    Time spent: 35 mins.More than 50% of that time was spent in counseling and/or coordination of care.      Burnadette PopAmrit Linzee Depaul, MD [facility] Pager [phone]  If 7PM-7AM, please contact night-coverage [email or web address] Password [email or web address] 07/25/2018, 11:35 AM

## 2018-07-25 NOTE — Consult Note (Signed)
Consultation Note Date: 07/25/2018   Patient Name: Taylor Ruiz  DOB: [birth year]  MRN: [id number]  Age / Sex: 83 y.o., female  PCP: Jilda Panda, MD Referring Physician: Shelly Coss, MD  Reason for Consultation: Establishing goals of care  HPI/Patient Profile: 83 y.o. female  with past medical history of dementia, HTN, vertigo admitted on 07/23/2018 with decreased intake, refusing meds, and FTT who was admitted with HCAP.  Palliative consulted for [place].   Clinical Assessment and Goals of Care: I met today with [name] today.  She would intermittently answer simple questions, but not reliably or consistently.   I called and was able to reach her sister in law, Taylor Ruiz.  [name] reports that [name] has been living at ALF and she has three family members who work together to act as her surrogate.    We discussed clinical course as well as wishes moving forward in regard to her care moving forward.  Concepts specific to code status and rehospitalization discussed.  We discussed difference between a aggressive medical intervention path and a palliative, comfort focused care path.   Ms. Tamala Julian reports that at this time, family would like to continuewith current interventions to see if her mental status and intake improve with treatment of PNA.  Questions and concerns addressed.   PMT will continue to support holistically.  SUMMARY OF RECOMMENDATIONS   - Full code/full scope treatment - Continue current interventions.  Family makes decisions as a group and would like to see how she does over the next 24-48 hours before discussing any possible changes to long term care plan.  Her sister in law will call tomorrow morning with time that family can discuss via conference call.  Involved family include Taylor Ruiz (sister in law), Taylor Ruiz (nephew), and Taylor Ruiz (cousin).   - Follow-up  with family tomorrow  Code Status/Advance Care Planning:  Full code  Palliative Prophylaxis:   Delirium Protocol  Additional Recommendations (Limitations, Scope, Preferences):  Full Scope Treatment  Psycho-social/Spiritual:   Desire for further Chaplaincy support:no  Additional Recommendations: Education on Hospice  Prognosis:   Unable to determine  Discharge Planning: To Be Determined      Primary Diagnoses: Present on Admission: . Dementia ([place]) . HCAP (healthcare-associated pneumonia) . AKI (acute kidney injury) ([place]) . FTT (failure to thrive) in adult . HTN (hypertension) . Acute metabolic encephalopathy   I have reviewed the medical record, interviewed the patient and family, and examined the patient. The following aspects are pertinent.  Past Medical History:  Diagnosis Date  . Dementia ([place])   . HTN (hypertension)   . Vertigo    Social History   Socioeconomic History  . Marital status: Widowed    Spouse name: Not on file  . Number of children: Not on file  . Years of education: Not on file  . Highest education level: Not on file  Occupational History  . Not on file  Social Needs  . Financial resource strain: Not on file  .  Food insecurity:    Worry: Not on file    Inability: Not on file  . Transportation needs:    Medical: Not on file    Non-medical: Not on file  Tobacco Use  . Smoking status: Never Smoker  . Smokeless tobacco: Never Used  . Tobacco comment: unsure if ever smoked or used smokeless tobacco  Substance and Sexual Activity  . Alcohol use: Not on file  . Drug use: Not on file  . Sexual activity: Not on file  Lifestyle  . Physical activity:    Days per week: Not on file    Minutes per session: Not on file  . Stress: Not on file  Relationships  . Social connections:    Talks on phone: Not on file    Gets together: Not on file    Attends religious service: Not on file    Active member of club or organization: Not on  file    Attends meetings of clubs or organizations: Not on file    Relationship status: Not on file  Other Topics Concern  . Not on file  Social History Narrative  . Not on file   History reviewed. No pertinent family history. Scheduled Meds: . Brexpiprazole  1 mg Oral QHS  . donepezil  5 mg Oral QHS  . enoxaparin (LOVENOX) injection  30 mg Subcutaneous Q24H  . feeding supplement (ENSURE ENLIVE)  237 mL Oral BID BM  . Gerhardt's butt cream   Topical BID  . meclizine  25 mg Oral BID  . mouth rinse  15 mL Mouth Rinse BID  . metoprolol tartrate  25 mg Oral BID  . potassium chloride  40 mEq Oral Once  . sodium chloride flush  10-40 mL Intracatheter Q12H  . valACYclovir  500 mg Oral BID   Continuous Infusions: . sodium chloride    . ceFEPime (MAXIPIME) IV Stopped (07/24/18 1049)   PRN Meds:.sodium chloride flush Medications Prior to Admission:  Prior to Admission medications   Medication Sig Start Date End Date Taking? Authorizing Provider  acetaminophen (TYLENOL) 325 MG tablet Take 650 mg by mouth every 6 (six) hours as needed for mild pain, moderate pain, fever or headache.   Yes [provider]  amLODipine (NORVASC) 10 MG tablet Take 10 mg by mouth daily.   Yes [provider]  Brexpiprazole (REXULTI) 0.5 MG TABS Take 1 mg by mouth at bedtime.   Yes [provider]  donepezil (ARICEPT) 5 MG tablet Take 5 mg by mouth at bedtime.   Yes [provider]  meclizine (ANTIVERT) 25 MG tablet Take 25 mg by mouth 2 (two) times daily.   Yes [provider]  metoprolol tartrate (LOPRESSOR) 25 MG tablet Take 1 tablet (25 mg total) by mouth 2 (two) times daily. 06/25/18  Yes Oretha Milch D, MD  pregabalin (LYRICA) 50 MG capsule Take 50 mg by mouth at bedtime.   Yes [provider]  valACYclovir (VALTREX) 500 MG tablet Take 500 mg by mouth 2 (two) times daily.   Yes [provider]   No Known Allergies Review of Systems Unable to  obtain  Physical Exam General: Lying in bed with eyes closed, in no acute distress. Unreliable in answering questions with occasional one word, inconsistent answers  HEENT: No bruits, no goiter, no JVD Heart: Regular rate and rhythm. No murmur appreciated. Lungs: Decreases air movement Abdomen: Soft, nontender, nondistended, positive bowel sounds.  Ext: No significant edema Skin: Warm and dry  Vital Signs: BP 124/72 (BP Location: Left Arm)   Pulse 93   Temp 98.4 F (36.9 C) (Oral)   Resp (!) 25   Ht 5' 8"  (1.727 m)   Wt 67.5 kg   SpO2 92%   BMI 22.63 kg/m  Pain Scale: PAINAD   Pain Score: Asleep   SpO2: SpO2: 92 % O2 Device:SpO2: 92 % O2 Flow Rate: .   IO: Intake/output summary:   Intake/Output Summary (Last 24 hours) at 07/25/2018 2355 Last data filed at 07/25/2018 0400 Gross per 24 hour  Intake 1891.28 ml  Output -  Net 1891.28 ml    LBM: Last BM Date: (PTA) Baseline Weight: Weight: 67.5 kg Most recent weight: Weight: 67.5 kg     Palliative Assessment/Data:   Flowsheet Rows     Most Recent Value  Intake Tab  Referral Department  Hospitalist  Unit at Time of Referral  ER  Palliative Care Primary Diagnosis  Sepsis/Infectious Disease  Date Notified  07/24/18  Palliative Care Type  New Palliative care  Reason for referral  Clarify Goals of Care  Date of Admission  07/24/18  Date first seen by Palliative Care  07/24/18  # of days Palliative referral response time  0 Day(s)  # of days IP prior to Palliative referral  0  Clinical Assessment  Palliative Performance Scale Score  40%  Pain Max last 24 hours  Not able to report  Pain Min Last 24 hours  Not able to report  Dyspnea Max Last 24 Hours  Not able to report  Dyspnea Min Last 24 hours  Not able to report  Psychosocial & Spiritual Assessment  Palliative Care Outcomes  Patient/Family meeting held?  Yes  Who was at the meeting?  Sister in law via phone  Palliative Care Outcomes  Clarified goals of care      Time in: 1515 Time out: 1630 Time Total: 75 minutes Greater than 50%  of this time was spent counseling and coordinating care related to the above assessment and plan.  Signed by: Micheline Rough, MD   Please contact Palliative Medicine Team phone at [phone] for questions and concerns.  For individual provider: See Shea Evans

## 2018-07-25 NOTE — TOC Progression Note (Signed)
Transition of Care [facility]) - Progression Note    Patient Details  Name: Cathleen Niehues MRN: [id number] Date of Birth: Jun 27, 1935  Transition of Care [facility]) CM/SW Contact  Woody Kronberg, Olegario Messier, RN Phone Number: 07/25/2018, 11:17 AM  Clinical Narrative:  Faxed w/confirmation update to [facility] rep Tanya fax#[phone]-H&P,PT/ST,nutrition, WOC SARS notes-They will assess if able to provide services @ ALF-they are aware of PT recc SNF.     Expected Discharge Plan: Assisted Living Barriers to Discharge: Continued Medical Work up  Expected Discharge Plan and Services Expected Discharge Plan: Assisted Living   Discharge Planning Services: CM Consult Post Acute Care Choice: (ALF) Living arrangements for the past 2 months: Post-Acute Facility                                       Social Determinants of Health (SDOH) Interventions    Readmission Risk Interventions No flowsheet data found.

## 2018-07-25 NOTE — TOC Progression Note (Signed)
Transition of Care [facility]) - Progression Note    Patient Details  Name: Taylor Ruiz MRN: [id number] Date of Birth: 1935-12-01  Transition of Care [facility]) CM/SW Contact  Gilles Trimpe, Olegario Messier, RN Phone Number: 07/25/2018, 3:47 PM  Clinical Narrative: Noted recc-may need higher level of care. Spoke to [employer] n law-agreed to fax out to SNF to have options if higher level needed.      Expected Discharge Plan: Assisted Living Barriers to Discharge: Continued Medical Work up  Expected Discharge Plan and Services Expected Discharge Plan: Assisted Living   Discharge Planning Services: CM Consult Post Acute Care Choice: (ALF) Living arrangements for the past 2 months: Post-Acute Facility                                       Social Determinants of Health (SDOH) Interventions    Readmission Risk Interventions No flowsheet data found.

## 2018-07-25 NOTE — Progress Notes (Signed)
  Speech Language Pathology Treatment: Dysphagia  Patient Details Name: Taylor Ruiz MRN: [id number] DOB: 25-Dec-1935 Today's Date: 07/25/2018 Time: 2993-7169 SLP Time Calculation (min) (ACUTE ONLY): 35 min  Assessment / Plan / Recommendation Clinical Impression  Today pt lying in bed asleep but did awaken to SLP verbal/tactile stimulation.    Upon oral inspection, pt appeared with brownish cottage cheese type secretions on right side of lingua - SLP requested RN to get oral suction set up given pt without output in her purewick and her elevated aspiration risk.  Upon oral suction SlP removed brown tinged material, right lateral lingua continued with minimal amount of white coating - ? if this could be consistent with oral candidiasis.   SlP provided pt with intake of icecream via spoon.   She orally manipulated bolus but then held without transiting - use of dry spoon stimulation not effective however larger icecream bolus administration did trigger swallow. *Improved stimulation, increased sensation likely.  No indication of aspiration with icecream she accepted however she did NOT Indicate or readily open mouth for more po.    Recommend pt only be offered po intake and oral suction if she does not swallow!  Pt will be high aspiration risk however with oral suction and careful feeding, she can be protective of her airway and enjoy minimal amounts of po.    Anticipate her intake will be VERY poor and doubtful for significant improvement with swallowing during this admission given her continued poor performance; Advised RN who agreed with plan.     HPI HPI: 83 yo female from [facility] admitted with FTT, poor intake, AMS, increased confusion.  Pt found to have RLL hazy density, infiltrate concerning for developing pna per CXR 07/24/2018.  She has h/o HTN, vertigo, pressure injury to skin.  Admitted with tachycardia, tachypenia.  Swallow eval ordered.         SLP Plan  Continue with current  plan of care       Recommendations  Diet recommendations: Other(comment)(full liquids offered only!) Liquids provided via: Teaspoon Medication Administration: Crushed with puree Supervision: Full supervision/cueing for compensatory strategies Compensations: Minimize environmental distractions;Slow rate;Small sips/bites;Other (Comment)(use dry spoon or cold item to elicit swallow if oral holding, assure pt swallows before giving more po, oral suction if not swallowing and after ALL intake) Postural Changes and/or Swallow Maneuvers: Seated upright 90 degrees;Upright 30-60 min after meal                Oral Care Recommendations: Oral care BID Follow up Recommendations: Skilled Nursing facility SLP Visit Diagnosis: Dysphagia, oral phase (R13.11) Plan: Continue with current plan of care       GO                Taylor Ruiz 07/25/2018, 1:21 PM  Donavan Burnet, MS [facility] SLP Acute Rehab Services Pager [phone] Office [phone]

## 2018-07-25 NOTE — Progress Notes (Signed)
Daily Progress Note   Patient Name: Taylor Ruiz       Date: 07/25/2018 DOB: [birth year]  Age: 83 y.o. MRN#: [id number] Attending Physician: Burnadette Pop, MD Primary Care Physician: Ralene Ok, MD Admit Date: 07/23/2018  Reason for Consultation/Follow-up: Establishing goals of care  Subjective: Patient with eyes closed and does not answer questions for me today.  I called and spoke with family via conference call, including sister in law, nephew, niece, and cousin.  See below.  Length of Stay: 1  Current Medications: Scheduled Meds:  . Brexpiprazole  1 mg Oral QHS  . donepezil  5 mg Oral QHS  . enoxaparin (LOVENOX) injection  30 mg Subcutaneous Q24H  . feeding supplement (ENSURE ENLIVE)  237 mL Oral BID BM  . Gerhardt's butt cream   Topical BID  . meclizine  25 mg Oral BID  . mouth rinse  15 mL Mouth Rinse BID  . metoprolol tartrate  25 mg Oral BID  . sodium chloride flush  10-40 mL Intracatheter Q12H  . valACYclovir  500 mg Oral BID    Continuous Infusions: . sodium chloride 75 mL/hr at 07/25/18 0945  . ceFEPime (MAXIPIME) IV 2 g (07/25/18 0947)    PRN Meds: sodium chloride flush  Physical Exam         General: Lying in bed with eyes closed, in no acute distress. Unreliable in answering questions with occasional one word, inconsistent answers  HEENT: No bruits, no goiter, no JVD Heart: Regular rate and rhythm. No murmur appreciated. Lungs: Decreases air movement Abdomen: Soft, nontender, nondistended, positive bowel sounds.  Ext: No significant edema Skin: Warm and dry  Vital Signs: BP 136/74 (BP Location: Right Arm)   Pulse 87   Temp 98.4 F (36.9 C) (Tympanic)   Resp 18   Ht  (1.727 m)   Wt 67.5 kg   SpO2 95%   BMI 22.63 kg/m  SpO2: SpO2: 95  % O2 Device: O2 Device: Room Air O2 Flow Rate:    Intake/output summary:   Intake/Output Summary (Last 24 hours) at 07/25/2018 2028 Last data filed at 07/25/2018 1914 Gross per 24 hour  Intake 1444.86 ml  Output -  Net 1444.86 ml   LBM: Last BM Date: (PTA) Baseline Weight: Weight: 67.5 kg Most recent weight: Weight: 67.5 kg  Palliative Assessment/Data:    Flowsheet Rows     Most Recent Value  Intake Tab  Referral Department  Hospitalist  Unit at Time of Referral  ER  Palliative Care Primary Diagnosis  Sepsis/Infectious Disease  Date Notified  07/24/18  Palliative Care Type  New Palliative care  Reason for referral  Clarify Goals of Care  Date of Admission  07/24/18  Date first seen by Palliative Care  07/24/18  # of days Palliative referral response time  0 Day(s)  # of days IP prior to Palliative referral  0  Clinical Assessment  Palliative Performance Scale Score  40%  Pain Max last 24 hours  Not able to report  Pain Min Last 24 hours  Not able to report  Dyspnea Max Last 24 Hours  Not able to report  Dyspnea Min Last 24 hours  Not able to report  Psychosocial & Spiritual Assessment  Palliative Care Outcomes  Patient/Family meeting held?  Yes  Who was at the meeting?  Sister in law via phone  Palliative Care Outcomes  Clarified goals of care      Patient Active Problem List   Diagnosis Date Noted  . Pneumonia 07/24/2018  . Pressure injury of skin 07/24/2018  . HCAP (healthcare-associated pneumonia) 07/24/2018  . AKI (acute kidney injury) (HCC) 07/24/2018  . FTT (failure to thrive) in adult 07/24/2018  . HTN (hypertension) 07/24/2018  . Acute metabolic encephalopathy 07/24/2018  . Syncope 06/24/2018  . Dementia (HCC) 06/24/2018  . Mild renal insufficiency 06/24/2018  . Hypokalemia 06/24/2018  . Elevated troponin 06/24/2018    Palliative Care Assessment & Plan   Patient Profile: 83 y.o. female  with past medical history of dementia, HTN, vertigo  admitted on 07/23/2018 with decreased intake, refusing meds, and FTT who was admitted with HCAP.  Palliative consulted for [facility]OC.   Assessment: Patient Active Problem List   Diagnosis Date Noted  . Pneumonia 07/24/2018  . Pressure injury of skin 07/24/2018  . HCAP (healthcare-associated pneumonia) 07/24/2018  . AKI (acute kidney injury) (HCC) 07/24/2018  . FTT (failure to thrive) in adult 07/24/2018  . HTN (hypertension) 07/24/2018  . Acute metabolic encephalopathy 07/24/2018  . Syncope 06/24/2018  . Dementia (HCC) 06/24/2018  . Mild renal insufficiency 06/24/2018  . Hypokalemia 06/24/2018  . Elevated troponin 06/24/2018   Recommendations/Plan:  Full code/full scope treatment  Long discussion with family about clinical course with acute problem of likely PNA/UTI as well as long term chronic problems with dementia.  Discussed long term trajectory of dementia and concern that she may be reaching point where she is not able to maintain her nutrition or she may aspirate with intake.  Family will discuss long term care plan, code status, etc this evening.  Plan for another meeting tomorrow, but goal at this point is to treat aggressively and transition from hospital back to ALF or SNF if ALF unable to accommodate.   Goals of Care and Additional Recommendations:  Limitations on Scope of Treatment: Full Scope Treatment  Code Status:    Code Status Orders  (From admission, onward)         Start     Ordered   07/24/18 0744  Full code  Continuous     07/24/18 0745        Code Status History    Date Active Date Inactive Code Status Order ID Comments User Context   06/24/2018 2357 06/26/2018 0033 Full Code [id number]  Briscoe Deutscherpyd, Timothy S, MD ED  Prognosis:   Unable to determine  Discharge Planning:  Skilled Nursing Facility for rehab with Palliative care service follow-up most likely  Care plan was discussed with family  Thank you for allowing the Palliative Medicine Team to  assist in the care of this patient.   Time In: 1200 Time Out: 1300 Total Time 60  Prolonged Time Billed No      Greater than 50%  of this time was spent counseling and coordinating care related to the above assessment and plan.  Romie Minus, MD  Please contact Palliative Medicine Team phone at [phone] for questions and concerns.

## 2018-07-25 NOTE — Consult Note (Signed)
WOC Nurse wound follow up Wounds were assessed and documented 6.3.20.  Topical orders for bedside RN present on chart. Continue with Gerhardts paste.   No further WOC needs at this time.  Will not follow at this time.  Please re-consult if needed.  Maple Hudson MSN, RN, FNP-BC CWON Wound, Ostomy, Continence Nurse Pager [phone]

## 2018-07-25 NOTE — NC FL2 (Signed)
[place] MEDICAID FL2 LEVEL OF CARE SCREENING TOOL     IDENTIFICATION  Patient Name: Taylor Ruiz Birthdate: [birth year] Sex: female Admission Date (Current Location): 07/23/2018  [facility] and [place] Number:  Producer, television/film/video and Address:  [facility],  [address] [place] [address]      Provider Number: [id number]  Attending Physician Name and Address:  Burnadette Pop, MD  Relative Name and Phone Number:  Seward Carol [phone]    Current Level of Care: Hospital Recommended Level of Care: Skilled Nursing Facility Prior Approval Number:    Date Approved/Denied:   PASRR Number:    Discharge Plan: SNF    Current Diagnoses: Patient Active Problem List   Diagnosis Date Noted  . Pneumonia 07/24/2018  . Pressure injury of skin 07/24/2018  . HCAP (healthcare-associated pneumonia) 07/24/2018  . AKI (acute kidney injury) (HCC) 07/24/2018  . FTT (failure to thrive) in adult 07/24/2018  . HTN (hypertension) 07/24/2018  . Acute metabolic encephalopathy 07/24/2018  . Syncope 06/24/2018  . Dementia (HCC) 06/24/2018  . Mild renal insufficiency 06/24/2018  . Hypokalemia 06/24/2018  . Elevated troponin 06/24/2018    Orientation RESPIRATION BLADDER Height & Weight     Self  Normal Incontinent Weight: 67.5 kg Height:  5\' 8"  (172.7 cm)  BEHAVIORAL SYMPTOMS/MOOD NEUROLOGICAL BOWEL NUTRITION STATUS      Incontinent Diet(full thin liquids)  AMBULATORY STATUS COMMUNICATION OF NEEDS Skin   Limited Assist Verbally Normal                       Personal Care Assistance Level of Assistance  Bathing, Feeding, Dressing Bathing Assistance: Limited assistance Feeding assistance: Limited assistance Dressing Assistance: Limited assistance     Functional Limitations Info             SPECIAL CARE FACTORS FREQUENCY  PT (By licensed PT), OT (By licensed OT), Speech therapy     PT Frequency: 5x week OT Frequency: 5x week     Speech Therapy  Frequency: 1x week      Contractures Contractures Info: Not present    Additional Factors Info  Code Status Code Status Info: full code             Current Medications (07/25/2018):  This is the current hospital active medication list Current Facility-Administered Medications  Medication Dose Route Frequency Provider Last Rate Last Dose  . 0.45 % sodium chloride infusion   Intravenous Continuous Burnadette Pop, MD 75 mL/hr at 07/25/18 0945    . Brexpiprazole TABS 1 mg  1 mg Oral QHS Rai, Ripudeep K, MD   1 mg at 07/24/18 2146  . ceFEPIme (MAXIPIME) 2 g in sodium chloride 0.9 % 100 mL IVPB  2 g Intravenous Q24H Poindexter, Leann T, RPH 200 mL/hr at 07/25/18 0947 2 g at 07/25/18 0947  . donepezil (ARICEPT) tablet 5 mg  5 mg Oral QHS Rai, Ripudeep K, MD   5 mg at 07/24/18 2146  . enoxaparin (LOVENOX) injection 30 mg  30 mg Subcutaneous Q24H Rai, Ripudeep K, MD   30 mg at 07/25/18 0938  . feeding supplement (ENSURE ENLIVE) (ENSURE ENLIVE) liquid 237 mL  237 mL Oral BID BM Rai, Ripudeep K, MD   237 mL at 07/24/18 1517  . Gerhardt's butt cream   Topical BID Rai, Ripudeep K, MD      . meclizine (ANTIVERT) tablet 25 mg  25 mg Oral BID Rai, Delene Ruffini, MD   25  mg at 07/25/18 0939  . MEDLINE mouth rinse  15 mL Mouth Rinse BID Rai, Ripudeep K, MD   15 mL at 07/25/18 0939  . metoprolol tartrate (LOPRESSOR) tablet 25 mg  25 mg Oral BID Rai, Ripudeep K, MD   25 mg at 07/25/18 0939  . sodium chloride flush (NS) 0.9 % injection 10-40 mL  10-40 mL Intracatheter Q12H Rai, Ripudeep K, MD   10 mL at 07/25/18 0939  . sodium chloride flush (NS) 0.9 % injection 10-40 mL  10-40 mL Intracatheter PRN Rai, Ripudeep K, MD   10 mL at 07/24/18 0832  . valACYclovir (VALTREX) tablet 500 mg  500 mg Oral BID Rai, Ripudeep K, MD   500 mg at 07/25/18 [id number]     Discharge Medications: Please see discharge summary for a list of discharge medications.  Relevant Imaging Results:  Relevant Lab Results:   Additional  Information [phone]  Addaline Peplinski, Olegario MessierKathy, RN

## 2018-07-26 LAB — CBC WITH DIFFERENTIAL/PLATELET
Abs Immature Granulocytes: 0.21 10*3/uL — ABNORMAL HIGH (ref 0.00–0.07)
Basophils Absolute: 0 10*3/uL (ref 0.0–0.1)
Basophils Relative: 0 %
Eosinophils Absolute: 0.1 10*3/uL (ref 0.0–0.5)
Eosinophils Relative: 0 %
HCT: 27.3 % — ABNORMAL LOW (ref 36.0–46.0)
Hemoglobin: 8.5 g/dL — ABNORMAL LOW (ref 12.0–15.0)
Immature Granulocytes: 2 %
Lymphocytes Relative: 10 %
Lymphs Abs: 1.4 10*3/uL (ref 0.7–4.0)
MCH: 30 pg (ref 26.0–34.0)
MCHC: 31.1 g/dL (ref 30.0–36.0)
MCV: 96.5 fL (ref 80.0–100.0)
Monocytes Absolute: 0.9 10*3/uL (ref 0.1–1.0)
Monocytes Relative: 6 %
Neutro Abs: 11.7 10*3/uL — ABNORMAL HIGH (ref 1.7–7.7)
Neutrophils Relative %: 82 %
Platelets: 230 10*3/uL (ref 150–400)
RBC: 2.83 MIL/uL — ABNORMAL LOW (ref 3.87–5.11)
RDW: 15.7 % — ABNORMAL HIGH (ref 11.5–15.5)
WBC: 14.3 10*3/uL — ABNORMAL HIGH (ref 4.0–10.5)
nRBC: 0 % (ref 0.0–0.2)

## 2018-07-26 LAB — BASIC METABOLIC PANEL
Anion gap: 4 — ABNORMAL LOW (ref 5–15)
BUN: 26 mg/dL — ABNORMAL HIGH (ref 8–23)
CO2: 17 mmol/L — ABNORMAL LOW (ref 22–32)
Calcium: 8.3 mg/dL — ABNORMAL LOW (ref 8.9–10.3)
Chloride: 130 mmol/L — ABNORMAL HIGH (ref 98–111)
Creatinine, Ser: 0.92 mg/dL (ref 0.44–1.00)
GFR calc Af Amer: >60 mL/min (ref >60)
GFR calc non Af Amer: 59 mL/min — ABNORMAL LOW (ref >60)
Glucose, Bld: 92 mg/dL (ref 70–99)
Potassium: 3.4 mmol/L — ABNORMAL LOW (ref 3.5–5.1)
Sodium: 151 mmol/L — ABNORMAL HIGH (ref 135–145)

## 2018-07-26 LAB — URINE CULTURE: Culture: >=100000 — AB

## 2018-07-26 MED ORDER — SODIUM CHLORIDE 0.9 % IV SOLN
2.0000 g | Freq: Two times a day (BID) | INTRAVENOUS | Status: DC
  Administered 2018-07-26 – 2018-07-28 (×4): 2 g via INTRAVENOUS
  Filled 2018-07-26 (×5): qty 2

## 2018-07-26 MED ORDER — POTASSIUM CHLORIDE 10 MEQ/100ML IV SOLN
10.0000 meq | INTRAVENOUS | Status: AC
  Administered 2018-07-26 (×4): 10 meq via INTRAVENOUS
  Filled 2018-07-26 (×4): qty 100

## 2018-07-26 MED ORDER — LACTATED RINGERS IV BOLUS
500.0000 mL | Freq: Once | INTRAVENOUS | Status: AC
  Administered 2018-07-26: 500 mL via INTRAVENOUS

## 2018-07-26 MED ORDER — DEXTROSE 5 % IV SOLN
INTRAVENOUS | Status: DC
  Administered 2018-07-26: 11:00:00 via INTRAVENOUS

## 2018-07-26 NOTE — Progress Notes (Signed)
Daily Progress Note   Patient Name: Taylor Ruiz       Date: 07/26/2018 DOB: [birth year]  Age: 83 y.o. MRN#: [id number] Attending Physician: Burnadette Pop, MD Primary Care Physician: Ralene Ok, MD Admit Date: 07/23/2018  Reason for Consultation/Follow-up: Establishing goals of care  Subjective: Patient with eyes closed and does not answer questions for me today but is eating lunch with RN assistance during my visit.  I called and spoke with family via conference call, including sister in law, nephew, niece, and cousin.  See below.  Length of Stay: 2  Current Medications: Scheduled Meds:  . Brexpiprazole  1 mg Oral QHS  . donepezil  5 mg Oral QHS  . enoxaparin (LOVENOX) injection  30 mg Subcutaneous Q24H  . feeding supplement (ENSURE ENLIVE)  237 mL Oral BID BM  . Gerhardt's butt cream   Topical BID  . meclizine  25 mg Oral BID  . mouth rinse  15 mL Mouth Rinse BID  . metoprolol tartrate  25 mg Oral BID  . sodium chloride flush  10-40 mL Intracatheter Q12H  . valACYclovir  500 mg Oral BID    Continuous Infusions: . ceFEPime (MAXIPIME) IV    . dextrose 75 mL/hr at 07/26/18 1048  . potassium chloride 10 mEq (07/26/18 1547)    PRN Meds: sodium chloride flush  Physical Exam         General: Lying in bed with eyes closed, in no acute distress. Unreliable in answering questions with occasional one word, inconsistent answers  HEENT: No bruits, no goiter, no JVD Heart: Regular rate and rhythm. No murmur appreciated. Lungs: Decreases air movement Abdomen: Soft, nontender, nondistended, positive bowel sounds.  Ext: No significant edema Skin: Warm and dry  Vital Signs: BP 122/74 (BP Location: Right Wrist)   Pulse 91   Temp 100.1 F (37.8 C) (Oral)   Resp (!) 22    Ht  (1.727 m)   Wt 67.5 kg   SpO2 96%   BMI 22.63 kg/m  SpO2: SpO2: 96 % O2 Device: O2 Device: Room Air O2 Flow Rate:    Intake/output summary:   Intake/Output Summary (Last 24 hours) at 07/26/2018 1612 Last data filed at 07/26/2018 1400 Gross per 24 hour  Intake 1557.24 ml  Output -  Net 1557.24 ml   LBM:  Last BM Date: 07/24/18 Baseline Weight: Weight: 67.5 kg Most recent weight: Weight: 67.5 kg       Palliative Assessment/Data:    Flowsheet Rows     Most Recent Value  Intake Tab  Referral Department  Hospitalist  Unit at Time of Referral  ER  Palliative Care Primary Diagnosis  Sepsis/Infectious Disease  Date Notified  07/24/18  Palliative Care Type  New Palliative care  Reason for referral  Clarify Goals of Care  Date of Admission  07/24/18  Date first seen by Palliative Care  07/24/18  # of days Palliative referral response time  0 Day(s)  # of days IP prior to Palliative referral  0  Clinical Assessment  Palliative Performance Scale Score  40%  Pain Max last 24 hours  Not able to report  Pain Min Last 24 hours  Not able to report  Dyspnea Max Last 24 Hours  Not able to report  Dyspnea Min Last 24 hours  Not able to report  Psychosocial & Spiritual Assessment  Palliative Care Outcomes  Patient/Family meeting held?  Yes  Who was at the meeting?  Sister in law via phone  Palliative Care Outcomes  Clarified goals of care      Patient Active Problem List   Diagnosis Date Noted  . Pneumonia 07/24/2018  . Pressure injury of skin 07/24/2018  . HCAP (healthcare-associated pneumonia) 07/24/2018  . AKI (acute kidney injury) (HCC) 07/24/2018  . FTT (failure to thrive) in adult 07/24/2018  . HTN (hypertension) 07/24/2018  . Acute metabolic encephalopathy 07/24/2018  . Syncope 06/24/2018  . Dementia (HCC) 06/24/2018  . Mild renal insufficiency 06/24/2018  . Hypokalemia 06/24/2018  . Elevated troponin 06/24/2018    Palliative Care Assessment & Plan    Patient Profile: 83 y.o. female  with past medical history of dementia, HTN, vertigo admitted on 07/23/2018 with decreased intake, refusing meds, and FTT who was admitted with HCAP.  Palliative consulted for GOC.   Assessment: Patient Active Problem List   Diagnosis Date Noted  . Pneumonia 07/24/2018  . Pressure injury of skin 07/24/2018  . HCAP (healthcare-associated pneumonia) 07/24/2018  . AKI (acute kidney injury) (HCC) 07/24/2018  . FTT (failure to thrive) in adult 07/24/2018  . HTN (hypertension) 07/24/2018  . Acute metabolic encephalopathy 07/24/2018  . Syncope 06/24/2018  . Dementia (HCC) 06/24/2018  . Mild renal insufficiency 06/24/2018  . Hypokalemia 06/24/2018  . Elevated troponin 06/24/2018   Recommendations/Plan:  Full code/full scope treatment  Reviewed last 24 hours and discussed again regarding acute problem of likely PNA/UTI as well as long term chronic problems with dementia.   Plan per family remains to treat aggressively and transition from hospital back to ALF or SNF if ALF unable to accommodate.  Family would like to speak to [facility] about SNF offers vs back to ALF.  Will reach out to case management.   Goals of Care and Additional Recommendations:  Limitations on Scope of Treatment: Full Scope Treatment  Code Status:    Code Status Orders  (From admission, onward)         Start     Ordered   07/24/18 0744  Full code  Continuous     07/24/18 0745        Code Status History    Date Active Date Inactive Code Status Order ID Comments User Context   06/24/2018 2357 06/26/2018 0033 Full Code [id number]  Briscoe Deutscher, MD ED       Prognosis:  Unable to determine  Discharge Planning:  Skilled Nursing Facility for rehab with Palliative care service follow-up most likely  Care plan was discussed with family  Thank you for allowing the Palliative Medicine Team to assist in the care of this patient.   Time In: 1400 Time Out: 1435 Total Time 35  Prolonged Time Billed No      Greater than 50%  of this time was spent counseling and coordinating care related to the above assessment and plan.  [name], MD  Please contact Palliative Medicine Team phone at [phone] for questions and concerns.

## 2018-07-26 NOTE — Progress Notes (Signed)
PROGRESS NOTE    Taylor Ruiz  ZOX:[id number] DOB: [birth year] DOA: 07/23/2018 PCP: Ralene Ok, MD   Brief Narrative: Patient is 83 year old female with history of dementia, hypertension, vertigo who presents from [place] assisted living facility due to confusion.  Patient has been dementia on her baseline and she was confused, unable to provide history on presentation.  She was not eating or taking her medications in the assisted living facility so she was sent here.  As per the report she was having increasing confusion beyond her baseline dementia.  Also noted to have low-grade fever of 101 F Fever, tachypnea and mild tachycardia. Covid-19 was negative.  Chest x-ray done in the emergency department showed right lower lobe haziness suggesting pneumonia.Urine culture also growing gram-negative rods.  Assessment & Plan:   Principal Problem:   HCAP (healthcare-associated pneumonia) Active Problems:   Dementia (HCC)   Pressure injury of skin   AKI (acute kidney injury) (HCC)   FTT (failure to thrive) in adult   HTN (hypertension)   Acute metabolic encephalopathy  Healthcare associated pneumonia: Chest x-ray showed right lower lobe suggesting pneumonia.  Possible aspiration pneumonia.  She was started on vancomycin and cefepime to cover for healthcare associated pneumonia.  Speech therapy has been consulted.  Currently on full liquid diet.  Currently she is afebrile and hemodynamically stable.  Continue gentle IV fluids.  Also has mild leukocytosis.vancomycin D/Ced . Currently respiratory status stable.  Saturating fine on room air.  Acute metabolic encephalopathy: Due to current metabolic derangements on dementia from her baseline.  This morning her mental status has improved.  She communicates without opening her eyes.  Answers to questions.  Continue supportive care.  Lyrica held.Her mental status might be close to baseline now.  Urinary tract infection: Urine culture showing  gram-negative rods.  We will follow-up final culture sensitivity.  Continue current antibiotic.  Acute kidney injury: Resolved with IV fluids.  Continue to monitor  Hyponatremia, hypokalemia: Start on D5W.  Potassium supplemented.  Failure to thrive: Nutrition following.  Hypertension: Current blood pressure stable.  Continue Lopressor  Pressure injury of the skin: Procedure in the coccyx and perineal area, deep tissue injury to left heel.  Wound care consulted.  GERD: Continue PPI  Chronic normocytic anemia: Currently H&H stable.  We will continue to monitor.  Anemia still with chronic medical problems, malnutrition  Debility/deconditioning: Patient seen by physical therapist and recommended skilled nursing facility on discharge.  She is from ALF Patient also seen by palliative care medicine.  Remains full code for now.  Pressure injury of skin:Pressure injury on coccyx and perineal area, deep tissue injury to left heel.Wound care consult placed    Nutrition Problem: Inadequate oral intake Etiology: inability to eat      DVT prophylaxis:Lovenox Code Status: Full Family Communication: None present at the bedside Disposition Plan: Skilled nursing facility likely tomorrow   Consultants: None  Procedures:None  Antimicrobials:  Anti-infectives (From admission, onward)   Start     Dose/Rate Route Frequency Ordered Stop   07/26/18 2200  ceFEPIme (MAXIPIME) 2 g in sodium chloride 0.9 % 100 mL IVPB     2 g 200 mL/hr over 30 Minutes Intravenous Every 12 hours 07/26/18 1110     07/24/18 1000  valACYclovir (VALTREX) tablet 500 mg     500 mg Oral 2 times daily 07/24/18 0745     07/24/18 0900  ceFEPIme (MAXIPIME) 2 g in sodium chloride 0.9 % 100 mL IVPB  Status:  Discontinued  2 g 200 mL/hr over 30 Minutes Intravenous Every 24 hours 07/24/18 0805 07/26/18 1110   07/24/18 0830  vancomycin (VANCOCIN) 1,500 mg in sodium chloride 0.9 % 500 mL IVPB     1,500 mg 250 mL/hr over 120  Minutes Intravenous  Once 07/24/18 0802 07/24/18 1221   07/24/18 0800  ceFEPIme (MAXIPIME) 1 g in sodium chloride 0.9 % 100 mL IVPB  Status:  Discontinued     1 g 200 mL/hr over 30 Minutes Intravenous Every 8 hours 07/24/18 0745 07/24/18 0804   07/24/18 0345  cefTRIAXone (ROCEPHIN) 1 g in sodium chloride 0.9 % 100 mL IVPB     1 g 200 mL/hr over 30 Minutes Intravenous  Once 07/24/18 0333 07/24/18 0534   07/24/18 0345  azithromycin (ZITHROMAX) 500 mg in sodium chloride 0.9 % 250 mL IVPB     500 mg 250 mL/hr over 60 Minutes Intravenous  Once 07/24/18 0333 07/24/18 1610      Subjective: Patient seen and examined the bedside this morning.  Hemodynamically stable.  Currently on room air.  Not in any kind of distress.  Communicates without opening her eyes.  Denies any complaints.  Objective: Vitals:   07/25/18 0554 07/25/18 1337 07/25/18 2125 07/26/18 0543  BP: 124/72 136/74 (!) 155/81 (!) 146/81  Pulse: 93 87 95 93  Resp: (!) Temp: 98.4 F (36.9 C) 98.4 F (36.9 C) 99.3 F (37.4 C) 99.5 F (37.5 C)  TempSrc: Oral Tympanic Axillary Oral  SpO2: 92% 95% 97% 97%  Weight:      Height:        Intake/Output Summary (Last 24 hours) at 07/26/2018 1342 Last data filed at 07/26/2018 1337 Gross per 24 hour  Intake 1781.68 ml  Output -  Net 1781.68 ml   Filed Weights   07/24/18 0602  Weight: 67.5 kg    Examination:    General exam: debilitated elderly female HEENT:PERRL,Oral mucosa moist, Ear/Nose normal on gross exam Respiratory system: Bilateral equal air entry, normal vesicular breath sounds, no wheezes or crackles  Cardiovascular system: S1 & S2 heard, RRR. No JVD, murmurs, rubs, gallops or clicks. Gastrointestinal system: Abdomen is nondistended, soft and nontender. No organomegaly or masses felt. Normal bowel sounds heard. Central nervous system: Not alert and  oriented. Communicates without opening her eyes.   Extremities: trace edema on bilateral lower  extremities, no clubbing ,no cyanosis   Data Reviewed: I have personally reviewed following labs and imaging studies  CBC: Recent Labs  Lab 07/24/18 0111 07/25/18 0409 07/26/18 0428  WBC 20.5* 14.6* 14.3*  NEUTROABS 17.3*  --  11.7*  HGB 10.9* 8.9* 8.5*  HCT 34.8* 28.2* 27.3*  MCV 96.1 95.9 96.5  PLT 209 208 230   Basic Metabolic Panel: Recent Labs  Lab 07/24/18 0111 07/25/18 0409 07/26/18 0428  NA 146* 151* 151*  K 3.7 3.3* 3.4*  CL 116* 128* 130*  CO2 20* 16* 17*  GLUCOSE 108* 85 92  BUN 49* 36* 26*  CREATININE 1.60* 1.07* 0.92  CALCIUM 9.3 8.3* 8.3*   GFR: Estimated Creatinine Clearance: 49.2 mL/min (by C-G formula based on SCr of 0.92 mg/dL). Liver Function Tests: Recent Labs  Lab 07/24/18 0111  AST 56*  ALT 33  ALKPHOS 71  BILITOT 0.7  PROT 8.5*  ALBUMIN 2.9*   No results for input(s): LIPASE, AMYLASE in the last 168 hours. No results for input(s): AMMONIA in the last 168 hours. Coagulation Profile: Recent Labs  Lab 07/24/18 0111  INR 1.1   Cardiac Enzymes: No results for input(s): CKTOTAL, CKMB, CKMBINDEX, TROPONINI in the last 168 hours. BNP (last 3 results) No results for input(s): PROBNP in the last 8760 hours. HbA1C: No results for input(s): HGBA1C in the last 72 hours. CBG: No results for input(s): GLUCAP in the last 168 hours. Lipid Profile: No results for input(s): CHOL, HDL, LDLCALC, TRIG, CHOLHDL, LDLDIRECT in the last 72 hours. Thyroid Function Tests: No results for input(s): TSH, T4TOTAL, FREET4, T3FREE, THYROIDAB in the last 72 hours. Anemia Panel: No results for input(s): VITAMINB12, FOLATE, FERRITIN, TIBC, IRON, RETICCTPCT in the last 72 hours. Sepsis Labs: Recent Labs  Lab 07/23/18 2314  LATICACIDVEN 1.9    Recent Results (from the past 240 hour(s))  Culture, blood (Routine x 2)     Status: None (Preliminary result)   Collection Time: 07/24/18 12:59 AM  Result Value Ref Range Status   Specimen Description   Final     BLOOD LEFT ANTECUBITAL Performed at [facility], [address], [place], [place] [address]    Special Requests   Final    BOTTLES DRAWN AEROBIC AND ANAEROBIC Blood Culture adequate volume Performed at [facility], [address], [place], [place] [address]    Culture   Final    NO GROWTH 2 DAYS Performed at [facility], [address], [place], [place] [address]    Report Status PENDING  Incomplete  SARS Coronavirus 2 (CEPHEID- Performed in [facility] hospital lab), Hosp Order     Status: None   Collection Time: 07/24/18 12:59 AM  Result Value Ref Range Status   SARS Coronavirus 2 NEGATIVE NEGATIVE Final    Comment: (NOTE) If result is NEGATIVE SARS-CoV-2 target nucleic acids are NOT DETECTED. The SARS-CoV-2 RNA is generally detectable in upper and lower  respiratory specimens during the acute phase of infection. The lowest  concentration of SARS-CoV-2 viral copies this assay can detect is 250  copies / mL. A negative result does not preclude SARS-CoV-2 infection  and should not be used as the sole basis for treatment or other  patient management decisions.  A negative result may occur with  improper specimen collection / handling, submission of specimen other  than nasopharyngeal swab, presence of viral mutation(s) within the  areas targeted by this assay, and inadequate number of viral copies  (<250 copies / mL). A negative result must be combined with clinical  observations, patient history, and epidemiological information. If result is POSITIVE SARS-CoV-2 target nucleic acids are DETECTED. The SARS-CoV-2 RNA is generally detectable in upper and lower  respiratory specimens dur ing the acute phase of infection.  Positive  results are indicative of active infection with SARS-CoV-2.  Clinical  correlation with patient history and other diagnostic information is  necessary to determine patient infection status.  Positive results do   not rule out bacterial infection or co-infection with other viruses. If result is PRESUMPTIVE POSTIVE SARS-CoV-2 nucleic acids MAY BE PRESENT.   A presumptive positive result was obtained on the submitted specimen  and confirmed on repeat testing.  While 2019 novel coronavirus  (SARS-CoV-2) nucleic acids may be present in the submitted sample  additional confirmatory testing may be necessary for epidemiological  and / or clinical management purposes  to differentiate between  SARS-CoV-2 and other Sarbecovirus currently known to infect humans.  If clinically indicated additional testing with an alternate test  methodology [phone]) is advised. The SARS-CoV-2 RNA is generally  detectable  in upper and lower respiratory sp ecimens during the acute  phase of infection. The expected result is Negative. Fact Sheet for Patients:  BoilerBrush.com.cy Fact Sheet for Healthcare Providers: https://pope.com/ This test is not yet approved or cleared by the [place] FDA and has been authorized for detection and/or diagnosis of SARS-CoV-2 by FDA under an Emergency Use Authorization (EUA).  This EUA will remain in effect (meaning this test can be used) for the duration of the COVID-19 declaration under Section 564(b)(1) of the Act, 21 U.S.C. section 360bbb-3(b)(1), unless the authorization is terminated or revoked sooner. Performed at [facility], [address] [place], [place] [address]   MRSA PCR Screening     Status: None   Collection Time: 07/24/18  6:34 AM  Result Value Ref Range Status   MRSA by PCR NEGATIVE NEGATIVE Final    Comment:        The GeneXpert MRSA Assay (FDA approved for NASAL specimens only), is one component of a comprehensive MRSA colonization surveillance program. It is not intended to diagnose MRSA infection nor to guide or monitor treatment for MRSA infections. Performed at [facility], [address], [place], [place] [address]   Culture, blood (routine x 2) Call MD if unable to obtain prior to antibiotics being given     Status: None (Preliminary result)   Collection Time: 07/24/18 10:20 AM  Result Value Ref Range Status   Specimen Description   Final    BLOOD BLOOD RIGHT HAND Performed at [facility], [address] [place], [place] [address]    Special Requests   Final    BOTTLES DRAWN AEROBIC ONLY Blood Culture results may not be optimal due to an inadequate volume of blood received in culture bottles Performed at [facility], [address] [place], [place] [address]    Culture   Final    NO GROWTH 2 DAYS Performed at [facility], [address] [place], [place] [address]    Report Status PENDING  Incomplete  Urine Culture     Status: Abnormal (Preliminary result)   Collection Time: 07/24/18  4:35 PM  Result Value Ref Range Status   Specimen Description   Final    URINE, RANDOM Performed at [facility], [address] [place], [place] [address]    Special Requests   Final    NONE Performed at [facility], [address] [place], [place] [address]    Culture >=100,000 COLONIES/mL GRAM NEGATIVE RODS (A)  Final   Report Status PENDING  Incomplete         Radiology Studies: No results found.      Scheduled Meds: . Brexpiprazole  1 mg Oral QHS  . donepezil  5 mg Oral QHS  . enoxaparin (LOVENOX) injection  30 mg Subcutaneous Q24H  . feeding supplement (ENSURE ENLIVE)  237 mL Oral BID BM  . Gerhardt's butt cream   Topical BID  . meclizine  25 mg Oral BID  . mouth rinse  15 mL Mouth Rinse BID  . metoprolol tartrate  25 mg Oral BID  . sodium chloride flush  10-40 mL Intracatheter Q12H  . valACYclovir  500 mg Oral BID   Continuous Infusions: . ceFEPime (MAXIPIME) IV    . dextrose 75 mL/hr at 07/26/18 1048  . potassium chloride        LOS: 2 days    Time spent: 35 mins.More than  50% of that time was spent in counseling and/or coordination of care.      Burnadette PopAmrit Faige Seely, MD [facility] Pager [phone]  If 7PM-7AM, please contact night-coverage [email or web address] Password [email or web address] 07/26/2018, 1:42 PM

## 2018-07-26 NOTE — Care Management Important Message (Signed)
Important Message  Patient Details IM Letter given to Sandford Craze RN to present to the Patient Name: Taylor Ruiz MRN: [id number] Date of Birth: 08-15-1935   Medicare Important Message Given:  Yes    Caren Macadam 07/26/2018, 10:17 AM

## 2018-07-26 NOTE — Progress Notes (Signed)
PHARMACY NOTE:  ANTIMICROBIAL RENAL DOSAGE ADJUSTMENT  Current antimicrobial regimen includes a mismatch between antimicrobial dosage and estimated renal function.  As per policy approved by the Pharmacy & Therapeutics and Medical Executive Committees, the antimicrobial dosage will be adjusted accordingly.  Current antimicrobial dosage:  Cefepime 2gm IV 24  Indication: PNA  Estimated Creatinine Clearance: 49.2 mL/min (by C-G formula based on SCr of 0.92 mg/dL).    Antimicrobial dosage has been changed to:  Cefepime 2gm IV q12hrs  Thank you for allowing pharmacy to be a part of this patient's care.  Herby Abraham, Pharm.D 07/26/2018 11:11 AM

## 2018-07-26 NOTE — Progress Notes (Signed)
Provided SNF bed offers to pt's daughter Renea Ee. She is reviewing to make selection. Renea Ee notes that likely they will want pt to go to rehab prior to returning to [facility] (as discussed in previous note). Daughter will call back.  Ilean Skill, MSW, LCSW Transitions of Care 07/26/2018 [phone] coverage for [phone]

## 2018-07-27 DIAGNOSIS — G300 Alzheimer's disease with early onset: Secondary | ICD-10-CM

## 2018-07-27 DIAGNOSIS — F0280 Dementia in other diseases classified elsewhere without behavioral disturbance: Secondary | ICD-10-CM

## 2018-07-27 LAB — BASIC METABOLIC PANEL
Anion gap: 6 (ref 5–15)
BUN: 17 mg/dL (ref 8–23)
CO2: 16 mmol/L — ABNORMAL LOW (ref 22–32)
Calcium: 8.2 mg/dL — ABNORMAL LOW (ref 8.9–10.3)
Chloride: 122 mmol/L — ABNORMAL HIGH (ref 98–111)
Creatinine, Ser: 0.82 mg/dL (ref 0.44–1.00)
GFR calc Af Amer: >60 mL/min (ref >60)
GFR calc non Af Amer: >60 mL/min (ref >60)
Glucose, Bld: 102 mg/dL — ABNORMAL HIGH (ref 70–99)
Potassium: 3.5 mmol/L (ref 3.5–5.1)
Sodium: 144 mmol/L (ref 135–145)

## 2018-07-27 LAB — CBC WITH DIFFERENTIAL/PLATELET
Abs Immature Granulocytes: 0.34 10*3/uL — ABNORMAL HIGH (ref 0.00–0.07)
Basophils Absolute: 0 10*3/uL (ref 0.0–0.1)
Basophils Relative: 0 %
Eosinophils Absolute: 0.1 10*3/uL (ref 0.0–0.5)
Eosinophils Relative: 1 %
HCT: 26.8 % — ABNORMAL LOW (ref 36.0–46.0)
Hemoglobin: 8.2 g/dL — ABNORMAL LOW (ref 12.0–15.0)
Immature Granulocytes: 2 %
Lymphocytes Relative: 12 %
Lymphs Abs: 1.8 10*3/uL (ref 0.7–4.0)
MCH: 29.3 pg (ref 26.0–34.0)
MCHC: 30.6 g/dL (ref 30.0–36.0)
MCV: 95.7 fL (ref 80.0–100.0)
Monocytes Absolute: 0.8 10*3/uL (ref 0.1–1.0)
Monocytes Relative: 5 %
Neutro Abs: 12.5 10*3/uL — ABNORMAL HIGH (ref 1.7–7.7)
Neutrophils Relative %: 80 %
Platelets: 222 10*3/uL (ref 150–400)
RBC: 2.8 MIL/uL — ABNORMAL LOW (ref 3.87–5.11)
RDW: 15.9 % — ABNORMAL HIGH (ref 11.5–15.5)
WBC: 15.6 10*3/uL — ABNORMAL HIGH (ref 4.0–10.5)
nRBC: 0.2 % (ref 0.0–0.2)

## 2018-07-27 MED ORDER — ACETAMINOPHEN 325 MG PO TABS
650.0000 mg | ORAL_TABLET | Freq: Four times a day (QID) | ORAL | Status: DC | PRN
  Filled 2018-07-27: qty 2

## 2018-07-27 NOTE — TOC Progression Note (Signed)
Transition of Care [facility]) - Progression Note    Patient Details  Name: Taylor Ruiz MRN: [id number] Date of Birth: Oct 13, 1935  Transition of [facility]) CM/SW [place], LCSW Phone Number: 07/27/2018, 1:09 PM  Clinical Narrative:   CSW spoke with patients sister in law [facility] via phone. Estill Bamberg stated that the family has decided to send patient to [facility] for rehab. CSW spoke with admission coordinator Abigail Butts at [facility] and she stated she will have a bed for patient tomorrow.      Expected Discharge Plan: Assisted Living Barriers to Discharge: Continued Medical Work up  Expected Discharge Plan and Services Expected Discharge Plan: Assisted Living   Discharge Planning Services: CM Consult Post Acute Care Choice: (ALF) Living arrangements for the past 2 months: Post-Acute Facility                                       Social Determinants of Health (SDOH) Interventions    Readmission Risk Interventions No flowsheet data found.

## 2018-07-27 NOTE — Progress Notes (Signed)
Daily Progress Note   Patient Name: Taylor Ruiz       Date: 07/27/2018 DOB: 08/07/1935  Age: 83 y.o. MRN#: [id number] Attending Physician: Burnadette PopAdhikari, Amrit, MD Primary Care Physician: Ralene OkMoreira, Roy, MD Admit Date: 07/23/2018  Reason for Consultation/Follow-up: Establishing goals of care  Subjective: Patient sitting in bed and much more interactive today.  It [birth year] birthday and we called and spoke with her family on the phone.  Mental status seems much improved compared to last 2 days.  See below.  Length of Stay: 3  Current Medications: Scheduled Meds:  . Brexpiprazole  1 mg Oral QHS  . donepezil  5 mg Oral QHS  . enoxaparin (LOVENOX) injection  30 mg Subcutaneous Q24H  . feeding supplement (ENSURE ENLIVE)  237 mL Oral BID BM  . Gerhardt's butt cream   Topical BID  . meclizine  25 mg Oral BID  . mouth rinse  15 mL Mouth Rinse BID  . metoprolol tartrate  25 mg Oral BID  . sodium chloride flush  10-40 mL Intracatheter Q12H  . valACYclovir  500 mg Oral BID    Continuous Infusions: . ceFEPime (MAXIPIME) IV 2 g (07/27/18 0922)  . dextrose 75 mL/hr at 07/26/18 1800    PRN Meds: sodium chloride flush  Physical Exam         General: Lying in bed with eyes closed, in no acute distress. Unreliable in answering questions but interactive with family on phone.  HEENT: No bruits, no goiter, no JVD Heart: Regular rate and rhythm. No murmur appreciated. Lungs: Decreases air movement Abdomen: Soft, nontender, nondistended, positive bowel sounds.  Ext: No significant edema Skin: Warm and dry  Vital Signs: BP (!) 147/79 (BP Location: Right Arm)   Pulse 90   Temp 98.9 F (37.2 C) (Oral)   Resp 19   Ht 5\' 8"  (1.727 m)   Wt 67.5 kg   SpO2 95%   BMI 22.63 kg/m  SpO2: SpO2: 95 %  O2 Device: O2 Device: Room Air O2 Flow Rate:    Intake/output summary:   Intake/Output Summary (Last 24 hours) at 07/27/2018 1016 Last data filed at 07/27/2018 0217 Gross per 24 hour  Intake 1746.32 ml  Output -  Net 1746.32 ml   LBM: Last BM Date: 07/24/18 Baseline Weight: Weight: 67.5 kg Most  recent weight: Weight: 67.5 kg       Palliative Assessment/Data:    Flowsheet Rows     Most Recent Value  Intake Tab  Referral Department  Hospitalist  Unit at Time of Referral  ER  Palliative Care Primary Diagnosis  Sepsis/Infectious Disease  Date Notified  07/24/18  Palliative Care Type  New Palliative care  Reason for referral  Clarify Goals of Care  Date of Admission  07/24/18  Date first seen by Palliative Care  07/24/18  # of days Palliative referral response time  0 Day(s)  # of days IP prior to Palliative referral  0  Clinical Assessment  Palliative Performance Scale Score  40%  Pain Max last 24 hours  Not able to report  Pain Min Last 24 hours  Not able to report  Dyspnea Max Last 24 Hours  Not able to report  Dyspnea Min Last 24 hours  Not able to report  Psychosocial & Spiritual Assessment  Palliative Care Outcomes  Patient/Family meeting held?  Yes  Who was at the meeting?  Sister in law via phone  Palliative Care Outcomes  Clarified goals of care      Patient Active Problem List   Diagnosis Date Noted  . Pneumonia 07/24/2018  . Pressure injury of skin 07/24/2018  . HCAP (healthcare-associated pneumonia) 07/24/2018  . AKI (acute kidney injury) ([place]) 07/24/2018  . FTT (failure to thrive) in adult 07/24/2018  . HTN (hypertension) 07/24/2018  . Acute metabolic encephalopathy 62/13/0865  . Syncope 06/24/2018  . Dementia ([place]) 06/24/2018  . Mild renal insufficiency 06/24/2018  . Hypokalemia 06/24/2018  . Elevated troponin 06/24/2018    Palliative Care Assessment & Plan   Patient Profile: 83 y.o. female  with past medical history of dementia, HTN, vertigo  admitted on 07/23/2018 with decreased intake, refusing meds, and FTT who was admitted with HCAP.  Palliative consulted for [place].   Assessment: Patient Active Problem List   Diagnosis Date Noted  . Pneumonia 07/24/2018  . Pressure injury of skin 07/24/2018  . HCAP (healthcare-associated pneumonia) 07/24/2018  . AKI (acute kidney injury) ([place]) 07/24/2018  . FTT (failure to thrive) in adult 07/24/2018  . HTN (hypertension) 07/24/2018  . Acute metabolic encephalopathy 78/46/9629  . Syncope 06/24/2018  . Dementia ([place]) 06/24/2018  . Mild renal insufficiency 06/24/2018  . Hypokalemia 06/24/2018  . Elevated troponin 06/24/2018   Recommendations/Plan:  Full code/full scope treatment  Plan per family remains to treat aggressively and transition from hospital back to ALF or SNF if ALF unable to accommodate.    Goals of Care and Additional Recommendations:  Limitations on Scope of Treatment: Full Scope Treatment  Code Status:    Code Status Orders  (From admission, onward)         Start     Ordered   07/24/18 0744  Full code  Continuous     07/24/18 0745        Code Status History    Date Active Date Inactive Code Status Order ID Comments User Context   06/24/2018 2357 06/26/2018 0033 Full Code [id number]  OpydIlene Qua, MD ED       Prognosis:   Unable to determine  Discharge Planning:  [facility] for rehab with Palliative care service follow-up most likely  Care plan was discussed with family  Thank you for allowing the Palliative Medicine Team to assist in the care of this patient.   Time In: 0950 Time Out: 1020 Total Time 30 Prolonged  Time Billed No      Greater than 50%  of this time was spent counseling and coordinating care related to the above assessment and plan.  Micheline Rough, MD  Please contact Palliative Medicine Team phone at [phone] for questions and concerns.

## 2018-07-27 NOTE — Progress Notes (Signed)
PROGRESS NOTE    Taylor Ruiz  EGB:[id number] DOB: 1935-11-06 DOA: 07/23/2018 PCP: Jilda Panda, MD   Brief Narrative: Patient is 83 year old female with history of dementia, hypertension, vertigo who presents from [facility] assisted living facility due to confusion.  Patient has been dementia on her baseline and she was confused, unable to provide history on presentation.  She was not eating or taking her medications in the assisted living facility so she was sent here.  As per the report she was having increasing confusion beyond her baseline dementia.  Also noted to have low-grade fever of 101 F Fever, tachypnea and mild tachycardia. Covid-19 was negative.  Chest x-ray done in the emergency department showed right lower lobe haziness suggesting pneumonia.Urine culture showed Pseudomonas. She had mild grade fever yesterday evening.  Assessment & Plan:   Principal Problem:   HCAP (healthcare-associated pneumonia) Active Problems:   Dementia ([place])   Pressure injury of skin   AKI (acute kidney injury) ([place])   FTT (failure to thrive) in adult   HTN (hypertension)   Acute metabolic encephalopathy  Healthcare associated pneumonia: Chest x-ray showed right lower lobe suggesting pneumonia.  Possible aspiration pneumonia.  She was started on vancomycin and cefepime to cover for healthcare associated pneumonia.  Speech therapy has been consulted.  Currently on full liquid diet.  Currently she is afebrile and hemodynamically stable.  Continue gentle IV fluids.  Also has mild leukocytosis.vancomycin D/Ced . Currently respiratory status stable.  Saturating fine on room air.  Acute metabolic encephalopathy: Due to current metabolic derangements on dementia from her baseline.  This morning her mental status remains on baseline.  She communicates ,answers to questions.  Continue supportive care.  Lyrica held.  Urinary tract infection: Urine culture showed pseudomonas.  Continue current antibiotic.   Acute kidney injury: Resolved with IV fluids.  Continue to monitor  Hyponatremia, hypokalemia: Resolved   Failure to thrive: Nutrition following.  Hypertension: Current blood pressure stable.  Continue Lopressor  Pressure injury of the skin:Pressure injury  on the coccyx and perineal area, deep tissue injury to left heel.  Wound care consulted.  GERD: Continue PPI  Chronic normocytic anemia: Currently H&H stable.  We will continue to monitor.  Anemia still with chronic medical problems, malnutrition  Debility/deconditioning: Patient seen by physical therapist and recommended skilled nursing facility on discharge.  She is from ALF Patient also seen by palliative care medicine.  Remains full code for now.   Nutrition Problem: Inadequate oral intake Etiology: inability to eat      DVT prophylaxis:Lovenox Code Status: Full Family Communication: None present at the bedside Disposition Plan: Skilled nursing facility likely tomorrow   Consultants: None  Procedures:None  Antimicrobials:  Anti-infectives (From admission, onward)   Start     Dose/Rate Route Frequency Ordered Stop   07/26/18 2200  ceFEPIme (MAXIPIME) 2 g in sodium chloride 0.9 % 100 mL IVPB     2 g 200 mL/hr over 30 Minutes Intravenous Every 12 hours 07/26/18 1110     07/24/18 1000  valACYclovir (VALTREX) tablet 500 mg     500 mg Oral 2 times daily 07/24/18 0745     07/24/18 0900  ceFEPIme (MAXIPIME) 2 g in sodium chloride 0.9 % 100 mL IVPB  Status:  Discontinued     2 g 200 mL/hr over 30 Minutes Intravenous Every 24 hours 07/24/18 0805 07/26/18 1110   07/24/18 0830  vancomycin (VANCOCIN) 1,500 mg in sodium chloride 0.9 % 500 mL IVPB  1,500 mg 250 mL/hr over 120 Minutes Intravenous  Once 07/24/18 0802 07/24/18 1221   07/24/18 0800  ceFEPIme (MAXIPIME) 1 g in sodium chloride 0.9 % 100 mL IVPB  Status:  Discontinued     1 g 200 mL/hr over 30 Minutes Intravenous Every 8 hours 07/24/18 0745 07/24/18 0804    07/24/18 0345  cefTRIAXone (ROCEPHIN) 1 g in sodium chloride 0.9 % 100 mL IVPB     1 g 200 mL/hr over 30 Minutes Intravenous  Once 07/24/18 0333 07/24/18 0534   07/24/18 0345  azithromycin (ZITHROMAX) 500 mg in sodium chloride 0.9 % 250 mL IVPB     500 mg 250 mL/hr over 60 Minutes Intravenous  Once 07/24/18 0333 07/24/18 [id number]      Subjective: Patient seen and examined the bedside this morning.  Hemodynamically stable.  Currently on room air.  Not in any kind of distress.  Communicates well.  Looks comfortable.  She had fever of 100.8 F yesterday evening.  Will monitor her at least 24 hours, and if she remains afebrile, will discharge to skilled nursing  facility, maybe tomorrow.  Objective: Vitals:   07/26/18 1347 07/26/18 2149 07/27/18 0005 07/27/18 0612  BP: 122/74 (!) 149/79  (!) 147/79  Pulse: 91 92  90  Resp: (!) 22 (!) 30 (!) 22 19  Temp: 100.1 F (37.8 C) (!) 100.8 F (38.2 C) 99.6 F (37.6 C) 98.9 F (37.2 C)  TempSrc: Oral Oral Axillary Oral  SpO2: 96% 100%  95%  Weight:      Height:        Intake/Output Summary (Last 24 hours) at 07/27/2018 1140 Last data filed at 07/27/2018 0217 Gross per 24 hour  Intake 1746.32 ml  Output -  Net 1746.32 ml   Filed Weights   07/24/18 0602  Weight: 67.5 kg    Examination:  General exam: Debilitated elderly female, not in distress HEENT:PERRL,Oral mucosa moist, Ear/Nose normal on gross exam Respiratory system: Bilateral equal air entry, normal vesicular breath sounds, no wheezes or crackles  Cardiovascular system: S1 & S2 heard, RRR. No JVD, murmurs, rubs, gallops or clicks. Gastrointestinal system: Abdomen is nondistended, soft and nontender. No organomegaly or masses felt. Normal bowel sounds heard. Central nervous system: Alert but not oriented. No focal neurological deficits. Extremities: trace  Edema on lower extremities, no clubbing ,no cyanosis, distal peripheral pulses palpable.     Data Reviewed: I have personally  reviewed following labs and imaging studies  CBC: Recent Labs  Lab 07/24/18 0111 07/25/18 0409 07/26/18 0428 07/27/18 0324  WBC 20.5* 14.6* 14.3* 15.6*  NEUTROABS 17.3*  --  11.7* 12.5*  HGB 10.9* 8.9* 8.5* 8.2*  HCT 34.8* 28.2* 27.3* 26.8*  MCV 96.1 95.9 96.5 95.7  PLT 209 208 230 222   Basic Metabolic Panel: Recent Labs  Lab 07/24/18 0111 07/25/18 0409 07/26/18 0428 07/27/18 0324  NA 146* 151* 151* 144  K 3.7 3.3* 3.4* 3.5  CL 116* 128* 130* 122*  CO2 20* 16* 17* 16*  GLUCOSE 108* 85 92 102*  BUN 49* 36* 26* 17  CREATININE 1.60* 1.07* 0.92 0.82  CALCIUM 9.3 8.3* 8.3* 8.2*   GFR: Estimated Creatinine Clearance: 54.3 mL/min (by C-G formula based on SCr of 0.82 mg/dL). Liver Function Tests: Recent Labs  Lab 07/24/18 0111  AST 56*  ALT 33  ALKPHOS 71  BILITOT 0.7  PROT 8.5*  ALBUMIN 2.9*   No results for input(s): LIPASE, AMYLASE in the last 168 hours. No results for  input(s): AMMONIA in the last 168 hours. Coagulation Profile: Recent Labs  Lab 07/24/18 0111  INR 1.1   Cardiac Enzymes: No results for input(s): CKTOTAL, CKMB, CKMBINDEX, TROPONINI in the last 168 hours. BNP (last 3 results) No results for input(s): PROBNP in the last 8760 hours. HbA1C: No results for input(s): HGBA1C in the last 72 hours. CBG: No results for input(s): GLUCAP in the last 168 hours. Lipid Profile: No results for input(s): CHOL, HDL, LDLCALC, TRIG, CHOLHDL, LDLDIRECT in the last 72 hours. Thyroid Function Tests: No results for input(s): TSH, T4TOTAL, FREET4, T3FREE, THYROIDAB in the last 72 hours. Anemia Panel: No results for input(s): VITAMINB12, FOLATE, FERRITIN, TIBC, IRON, RETICCTPCT in the last 72 hours. Sepsis Labs: Recent Labs  Lab 07/23/18 2314  LATICACIDVEN 1.9    Recent Results (from the past 240 hour(s))  Culture, blood (Routine x 2)     Status: None (Preliminary result)   Collection Time: 07/24/18 12:59 AM  Result Value Ref Range Status   Specimen  Description   Final    BLOOD LEFT ANTECUBITAL Performed at [facility], [address] [place], [place] [address]    Special Requests   Final    BOTTLES DRAWN AEROBIC AND ANAEROBIC Blood Culture adequate volume Performed at [facility], [address] [place], [place] [address]    Culture   Final    NO GROWTH 3 DAYS Performed at [facility], [address], [place], [place] [address]    Report Status PENDING  Incomplete  SARS Coronavirus 2 (CEPHEID- Performed in [facility] hospital lab), Hosp Order     Status: None   Collection Time: 07/24/18 12:59 AM  Result Value Ref Range Status   SARS Coronavirus 2 NEGATIVE NEGATIVE Final    Comment: (NOTE) If result is NEGATIVE SARS-CoV-2 target nucleic acids are NOT DETECTED. The SARS-CoV-2 RNA is generally detectable in upper and lower  respiratory specimens during the acute phase of infection. The lowest  concentration of SARS-CoV-2 viral copies this assay can detect is 250  copies / mL. A negative result does not preclude SARS-CoV-2 infection  and should not be used as the sole basis for treatment or other  patient management decisions.  A negative result may occur with  improper specimen collection / handling, submission of specimen other  than nasopharyngeal swab, presence of viral mutation(s) within the  areas targeted by this assay, and inadequate number of viral copies  (<250 copies / mL). A negative result must be combined with clinical  observations, patient history, and epidemiological information. If result is POSITIVE SARS-CoV-2 target nucleic acids are DETECTED. The SARS-CoV-2 RNA is generally detectable in upper and lower  respiratory specimens dur ing the acute phase of infection.  Positive  results are indicative of active infection with SARS-CoV-2.  Clinical  correlation with patient history and other diagnostic information is  necessary to determine patient infection  status.  Positive results do  not rule out bacterial infection or co-infection with other viruses. If result is PRESUMPTIVE POSTIVE SARS-CoV-2 nucleic acids MAY BE PRESENT.   A presumptive positive result was obtained on the submitted specimen  and confirmed on repeat testing.  While 2019 novel coronavirus  (SARS-CoV-2) nucleic acids may be present in the submitted sample  additional confirmatory testing may be necessary for epidemiological  and / or clinical management purposes  to differentiate between  SARS-CoV-2 and other Sarbecovirus currently known to infect humans.  If clinically indicated additional testing  with an alternate test  methodology [phone] is advised. The SARS-CoV-2 RNA is generally  detectable in upper and lower respiratory sp ecimens during the acute  phase of infection. The expected result is Negative. Fact Sheet for Patients:  BoilerBrush.com.cyhttps://www.fda.gov/media/136312/download Fact Sheet for Healthcare Providers: https://pope.com/https://www.fda.gov/media/136313/download This test is not yet approved or cleared by the [place] FDA and has been authorized for detection and/or diagnosis of SARS-CoV-2 by FDA under an Emergency Use Authorization (EUA).  This EUA will remain in effect (meaning this test can be used) for the duration of the COVID-19 declaration under Section 564(b)(1) of the Act, 21 U.S.C. section 360bbb-3(b)(1), unless the authorization is terminated or revoked sooner. Performed at [facility], [address] [place], [place] [address]   MRSA PCR Screening     Status: None   Collection Time: 07/24/18  6:34 AM  Result Value Ref Range Status   MRSA by PCR NEGATIVE NEGATIVE Final    Comment:        The GeneXpert MRSA Assay (FDA approved for NASAL specimens only), is one component of a comprehensive MRSA colonization surveillance program. It is not intended to diagnose MRSA infection nor to guide or monitor treatment for MRSA infections.  Performed at [facility], [address] [place], [place] [address]   Culture, blood (routine x 2) Call MD if unable to obtain prior to antibiotics being given     Status: None (Preliminary result)   Collection Time: 07/24/18 10:20 AM  Result Value Ref Range Status   Specimen Description   Final    BLOOD RIGHT HAND Performed at [facility], [address] [place], [place] [address]    Special Requests   Final    BOTTLES DRAWN AEROBIC ONLY Blood Culture results may not be optimal due to an inadequate volume of blood received in culture bottles Performed at [facility], [address] [place], [place] [address]    Culture   Final    NO GROWTH 3 DAYS Performed at [facility], [address] [place], [place] [address]    Report Status PENDING  Incomplete  Urine Culture     Status: Abnormal   Collection Time: 07/24/18  4:35 PM  Result Value Ref Range Status   Specimen Description   Final    URINE, RANDOM Performed at [facility], [address] [place], [place] [address]    Special Requests   Final    NONE Performed at [facility], [address] [place], [place] [address]    Culture >=100,000 COLONIES/mL PSEUDOMONAS AERUGINOSA (A)  Final   Report Status 07/26/2018 FINAL  Final   Organism ID, Bacteria PSEUDOMONAS AERUGINOSA (A)  Final      Susceptibility   Pseudomonas aeruginosa - MIC*    CEFTAZIDIME 4 SENSITIVE Sensitive     CIPROFLOXACIN <=0.25 SENSITIVE Sensitive     GENTAMICIN <=1 SENSITIVE Sensitive     IMIPENEM 1 SENSITIVE Sensitive     PIP/TAZO 8 SENSITIVE Sensitive     CEFEPIME 2 SENSITIVE Sensitive     * >=100,000 COLONIES/mL PSEUDOMONAS AERUGINOSA         Radiology Studies: No results found.      Scheduled Meds: . Brexpiprazole  1 mg Oral QHS  . donepezil  5 mg Oral QHS  . enoxaparin (LOVENOX) injection  30 mg Subcutaneous Q24H  . feeding supplement  (ENSURE ENLIVE)  237 mL Oral BID BM  .  Gerhardt's butt cream   Topical BID  . meclizine  25 mg Oral BID  . mouth rinse  15 mL Mouth Rinse BID  . metoprolol tartrate  25 mg Oral BID  . sodium chloride flush  10-40 mL Intracatheter Q12H  . valACYclovir  500 mg Oral BID   Continuous Infusions: . ceFEPime (MAXIPIME) IV 2 g (07/27/18 0922)     LOS: 3 days    Time spent: 35 mins.More than 50% of that time was spent in counseling and/or coordination of care.      Burnadette PopAmrit Ronan Duecker, MD [facility] Pager [phone]  If 7PM-7AM, please contact night-coverage [email or web address] Password [email or web address] 07/27/2018, 11:40 AM

## 2018-07-28 LAB — CBC WITH DIFFERENTIAL/PLATELET
Abs Immature Granulocytes: 0.4 10*3/uL — ABNORMAL HIGH (ref 0.00–0.07)
Basophils Absolute: 0.1 10*3/uL (ref 0.0–0.1)
Basophils Relative: 0 %
Eosinophils Absolute: 0.2 10*3/uL (ref 0.0–0.5)
Eosinophils Relative: 1 %
HCT: 29.7 % — ABNORMAL LOW (ref 36.0–46.0)
Hemoglobin: 9.5 g/dL — ABNORMAL LOW (ref 12.0–15.0)
Immature Granulocytes: 3 %
Lymphocytes Relative: 13 %
Lymphs Abs: 1.9 10*3/uL (ref 0.7–4.0)
MCH: 30.2 pg (ref 26.0–34.0)
MCHC: 32 g/dL (ref 30.0–36.0)
MCV: 94.3 fL (ref 80.0–100.0)
Monocytes Absolute: 0.9 10*3/uL (ref 0.1–1.0)
Monocytes Relative: 6 %
Neutro Abs: 11.5 10*3/uL — ABNORMAL HIGH (ref 1.7–7.7)
Neutrophils Relative %: 77 %
Platelets: 235 10*3/uL (ref 150–400)
RBC: 3.15 MIL/uL — ABNORMAL LOW (ref 3.87–5.11)
RDW: 15.7 % — ABNORMAL HIGH (ref 11.5–15.5)
WBC: 14.9 10*3/uL — ABNORMAL HIGH (ref 4.0–10.5)
nRBC: 0 % (ref 0.0–0.2)

## 2018-07-28 LAB — BASIC METABOLIC PANEL
Anion gap: 6 (ref 5–15)
BUN: 14 mg/dL (ref 8–23)
CO2: 20 mmol/L — ABNORMAL LOW (ref 22–32)
Calcium: 8.6 mg/dL — ABNORMAL LOW (ref 8.9–10.3)
Chloride: 112 mmol/L — ABNORMAL HIGH (ref 98–111)
Creatinine, Ser: 0.79 mg/dL (ref 0.44–1.00)
GFR calc Af Amer: >60 mL/min (ref >60)
GFR calc non Af Amer: >60 mL/min (ref >60)
Glucose, Bld: 94 mg/dL (ref 70–99)
Potassium: 3.7 mmol/L (ref 3.5–5.1)
Sodium: 138 mmol/L (ref 135–145)

## 2018-07-28 MED ORDER — AMOXICILLIN-POT CLAVULANATE 875-125 MG PO TABS: 1.0000 | ORAL_TABLET | Freq: Two times a day (BID) | ORAL | 0 refills | Status: AC

## 2018-07-28 NOTE — TOC Transition Note (Signed)
Transition of Care [facility]) - CM/SW Discharge Note   Patient Details  Name: Taylor Ruiz MRN: [id number] Date of Birth: Aug 21, 1935  [facility] CM/SW Contact:  Wende Neighbors, LCSW Phone Number: 07/28/2018, 11:01 AM   Clinical Narrative:   Patient to discharge to [facility] via [facility]. Patients family aware of dc. RN to please call [phone] (rm# [phone]) for report. Facility requested that patient be pick up by PTAR at 5pm    Final next level of care: [place] Barriers to Discharge: No Barriers Identified   Patient Goals and CMS Choice        Discharge Placement PASRR number recieved: 07/28/18 Existing PASRR number confirmed : 07/28/18          Patient [facility] bed at: [facility] Patient to be transferred to facility by: ptar Name of family member notified: pt's sister in law Patient and family notified of of transfer: 07/28/18  Discharge Plan and Services   Discharge Planning Services: CM Consult Post Acute Care Choice: (ALF)                               Social Determinants of Health (SDOH) Interventions     Readmission Risk Interventions No flowsheet data found.

## 2018-07-28 NOTE — Progress Notes (Signed)
Report called to [facility] at [facility].

## 2018-07-28 NOTE — Discharge Summary (Signed)
Physician Discharge Summary  Taylor Ruiz ZOX:[id number] DOB: 06-Feb-1936 DOA: 07/23/2018  PCP: Ralene Ok, MD  Admit date: 07/23/2018 Discharge date: 07/28/2018  Admitted From: Home Disposition:  Home  Discharge Condition:Stable CODE STATUS:FULL Diet recommendation: Full liquid Brief/Interim Summary:  Patient is 83 year old female with history of dementia, hypertension, vertigo who presents from [facility] [place] assisted living facility due to confusion.  Patient has been dementia on her baseline and she was confused, unable to provide history on presentation.  She was not eating or taking her medications in the assisted living facility so she was sent here.  As per the report she was having increasing confusion beyond her baseline dementia.  Also noted to have low-grade fever of 101 F Fever, tachypnea and mild tachycardia. Covid-19 was negative.  Chest x-ray done in the emergency department showed right lower lobe haziness suggesting pneumonia.Urine culture showed Pseudomonas.  Blood cultures have not shown any growth till date. She was seen by physical therapy and recommended skilled nursing facility on discharge. Currently her respiratory status is stable.  Antibiotics will be changed to oral.  She is stable for discharge to skilled nursing facility today.  Following problems were addressed during her hospitalization:  Healthcare associated pneumonia/Aspiration peumonia: Chest x-ray showed right lower lobe suggesting pneumonia.  Possible aspiration pneumonia.  She was started on vancomycin and cefepime to cover for healthcare associated pneumonia.  Speech therapy has been consulted. Recommended full liquid diet.  Currently she is afebrile and hemodynamically stable.   Also has mild leukocytosis. Currently respiratory status stable.  Saturating fine on room air. Antibiotics will be changed to oral.  She needs to follow-up with speech therapy in the skilled nursing facility.  Acute metabolic  encephalopathy: Due to current metabolic derangements on dementia from her baseline.  This morning her mental status remains on baseline.  She communicates ,answers to questions.  ontinue supportive care.  Urinary tract infection: Urine culture showed pseudomonas.  Continue current antibiotic.  Acute kidney injury: Resolved with IV fluids.  Continue to monitor  Hyponatremia, hypokalemia: Resolved   Failure to thrive: Nutrition was following.  Hypertension: Current blood pressure stable.  Continue Lopressor  Pressure injury of the skin:Pressure injury  on the coccyx and perineal area, deep tissue injury to left heel.  Wound care was consulted.  GERD: Continue PPI  Chronic normocytic anemia: Currently H&H stable.  Anemia associated with chronic medical problems, malnutrition  Debility/deconditioning: Patient seen by physical therapist and recommended skilled nursing facility on discharge.  She is from ALF Patient also seen by palliative care medicine.  Remains full code for now.She will be followed by palliative care as an outpatient.   Discharge Diagnoses:  Principal Problem:   HCAP (healthcare-associated pneumonia) Active Problems:   Dementia (HCC)   Pressure injury of skin   AKI (acute kidney injury) (HCC)   FTT (failure to thrive) in adult   HTN (hypertension)   Acute metabolic encephalopathy    Discharge Instructions  Discharge Instructions    Diet - low sodium heart healthy   Complete by:  As directed    Discharge instructions   Complete by:  As directed    1)Please do CBC and BMP tests in a week. 2)Take prescribed medications as instructed.   Increase activity slowly   Complete by:  As directed      Allergies as of 07/28/2018   No Known Allergies     Medication List    TAKE these medications   acetaminophen 325 MG tablet Commonly known  as:  TYLENOL Take 650 mg by mouth every 6 (six) hours as needed for mild pain, moderate pain, fever or  headache.   amLODipine 10 MG tablet Commonly known as:  NORVASC Take 10 mg by mouth daily.   amoxicillin-clavulanate 875-125 MG tablet Commonly known as:  Augmentin Take 1 tablet by mouth 2 (two) times daily for 3 days.   donepezil 5 MG tablet Commonly known as:  ARICEPT Take 5 mg by mouth at bedtime.   meclizine 25 MG tablet Commonly known as:  ANTIVERT Take 25 mg by mouth 2 (two) times daily.   metoprolol tartrate 25 MG tablet Commonly known as:  LOPRESSOR Take 1 tablet (25 mg total) by mouth 2 (two) times daily.   pregabalin 50 MG capsule Commonly known as:  LYRICA Take 50 mg by mouth at bedtime.   Rexulti 0.5 MG Tabs Generic drug:  Brexpiprazole Take 1 mg by mouth at bedtime.   valACYclovir 500 MG tablet Commonly known as:  VALTREX Take 500 mg by mouth 2 (two) times daily.      Contact information for after-discharge care    Destination    [facility] Preferred SNF .   Service:  Skilled Nursing Contact information: [address] [place] [id number] [phone]             No Known Allergies  Consultations:  Palliatiave care   Procedures/Studies: Dg Chest Port 1 View  Result Date: 07/24/2018 CLINICAL DATA:  83 year old female with sepsis. EXAM: PORTABLE CHEST 1 VIEW COMPARISON:  Earlier radiograph dated 07/24/2018 FINDINGS: There is a background of emphysema and chronic bronchitic changes. An area of diffuse hazy density in the right lower lung field appears new since the radiograph of 06/24/2018 and concerning for developing infiltrate. There is no pleural effusion or pneumothorax. The cardiac silhouette is within normal limits. No acute osseous pathology. IMPRESSION: Right lower lung field hazy density concerning for developing infiltrate. Clinical correlation and follow-up recommended. Electronically Signed   By: Elgie CollardArash  Radparvar M.D.   On: 07/24/2018 04:06   Dg Chest Port 1 View  Result Date:  07/24/2018 CLINICAL DATA:  Sepsis EXAM: PORTABLE CHEST 1 VIEW COMPARISON:  06/24/2018 FINDINGS: There is a diffuse hazy appearance of the right lower lung field. There is blunting of the right costophrenic angle, however this is not well visualized secondary to overlapping wires. The lung apices are suboptimally evaluated secondary to overlapping structures. There is no acute osseous abnormality. No detection of a pneumothorax. IMPRESSION: Findings concerning for developing right lower lobe pneumonia, however evaluation is limited by suboptimal patient positioning. Consider a short interval repeat chest x-ray for further evaluation. Electronically Signed   By: Katherine Mantlehristopher  Green M.D.   On: 07/24/2018 02:02       Subjective:  Patient seen and examined the bedside this morning.  Hemodynamically stable.  Afebrile.  Mental status on baseline.  Stable for discharge Discharge Exam: Vitals:   07/28/18 0519 07/28/18 0519  BP: (!) 159/77 (!) 159/77  Pulse: 85 85  Resp: 20 20  Temp: 97.9 F (36.6 C) 97.9 F (36.6 C)  SpO2: 98% 98%   Vitals:   07/27/18 1741 07/27/18 2146 07/28/18 0519 07/28/18 0519  BP:  (!) 154/78 (!) 159/77 (!) 159/77  Pulse:  98 85 85  Resp:  20 20 20   Temp: 99.6 F (37.6 C) 98.7 F (37.1 C) 97.9 F (36.6 C) 97.9 F (36.6 C)  TempSrc: Axillary Oral Oral Oral  SpO2:  97% 98% 98%  Weight:      Height:        General: Pt is alert, awake, not in acute distress Cardiovascular: RRR, S1/S2 +, no rubs, no gallops Respiratory: CTA bilaterally, no wheezing, no rhonchi Abdominal: Soft, NT, ND, bowel sounds + Extremities: no edema, no cyanosis    The results of significant diagnostics from this hospitalization (including imaging, microbiology, ancillary and laboratory) are listed below for reference.     Microbiology: Recent Results (from the past 240 hour(s))  Culture, blood (Routine x 2)     Status: None (Preliminary result)   Collection Time: 07/24/18 12:59 AM   Result Value Ref Range Status   Specimen Description   Final    BLOOD LEFT ANTECUBITAL Performed at [facility], [address] [place], [place] [address]    Special Requests   Final    BOTTLES DRAWN AEROBIC AND ANAEROBIC Blood Culture adequate volume Performed at [facility], [address] [place], [place] [address]    Culture   Final    NO GROWTH 4 DAYS Performed at [facility], [address] [place], [place] [address]    Report Status PENDING  Incomplete  SARS Coronavirus 2 (CEPHEID- Performed in [facility] lab), Hosp Order     Status: None   Collection Time: 07/24/18 12:59 AM  Result Value Ref Range Status   SARS Coronavirus 2 NEGATIVE NEGATIVE Final    Comment: (NOTE) If result is NEGATIVE SARS-CoV-2 target nucleic acids are NOT DETECTED. The SARS-CoV-2 RNA is generally detectable in upper and lower  respiratory specimens during the acute phase of infection. The lowest  concentration of SARS-CoV-2 viral copies this assay can detect is 250  copies / mL. A negative result does not preclude SARS-CoV-2 infection  and should not be used as the sole basis for treatment or other  patient management decisions.  A negative result may occur with  improper specimen collection / handling, submission of specimen other  than nasopharyngeal swab, presence of viral mutation(s) within the  areas targeted by this assay, and inadequate number of viral copies  (<250 copies / mL). A negative result must be combined with clinical  observations, patient history, and epidemiological information. If result is POSITIVE SARS-CoV-2 target nucleic acids are DETECTED. The SARS-CoV-2 RNA is generally detectable in upper and lower  respiratory specimens dur ing the acute phase of infection.  Positive  results are indicative of active infection with SARS-CoV-2.  Clinical  correlation with patient history and other diagnostic information is   necessary to determine patient infection status.  Positive results do  not rule out bacterial infection or co-infection with other viruses. If result is PRESUMPTIVE POSTIVE SARS-CoV-2 nucleic acids MAY BE PRESENT.   A presumptive positive result was obtained on the submitted specimen  and confirmed on repeat testing.  While 2019 novel coronavirus  (SARS-CoV-2) nucleic acids may be present in the submitted sample  additional confirmatory testing may be necessary for epidemiological  and / or clinical management purposes  to differentiate between  SARS-CoV-2 and other Sarbecovirus currently known to infect humans.  If clinically indicated additional testing with an alternate test  methodology [phone] is advised. The SARS-CoV-2 RNA is generally  detectable in upper and lower respiratory sp ecimens during the acute  phase of infection. The expected result is Negative. Fact Sheet for Patients:  BoilerBrush.com.cyhttps://www.fda.gov/media/136312/download Fact Sheet for Healthcare Providers: https://pope.com/https://www.fda.gov/media/136313/download This test is not yet approved or cleared by the [place]  FDA and has been authorized for detection and/or diagnosis of SARS-CoV-2 by FDA under an Emergency Use Authorization (EUA).  This EUA will remain in effect (meaning this test can be used) for the duration of the COVID-19 declaration under Section 564(b)(1) of the Act, 21 U.S.C. section 360bbb-3(b)(1), unless the authorization is terminated or revoked sooner. Performed at [facility], [address] [place], [place] [address]   MRSA PCR Screening     Status: None   Collection Time: 07/24/18  6:34 AM  Result Value Ref Range Status   MRSA by PCR NEGATIVE NEGATIVE Final    Comment:        The GeneXpert MRSA Assay (FDA approved for NASAL specimens only), is one component of a comprehensive MRSA colonization surveillance program. It is not intended to diagnose MRSA infection nor to guide  or monitor treatment for MRSA infections. Performed at [facility], [address] [place], [place] [address]   Culture, blood (routine x 2) Call MD if unable to obtain prior to antibiotics being given     Status: None (Preliminary result)   Collection Time: 07/24/18 10:20 AM  Result Value Ref Range Status   Specimen Description   Final    BLOOD RIGHT HAND Performed at [facility], [address] [place], [place] [address]    Special Requests   Final    BOTTLES DRAWN AEROBIC ONLY Blood Culture results may not be optimal due to an inadequate volume of blood received in culture bottles Performed at [facility], [address] [place], [place] [address]    Culture   Final    NO GROWTH 4 DAYS Performed at [facility], [address] [place], [place] [address]    Report Status PENDING  Incomplete  Urine Culture     Status: Abnormal   Collection Time: 07/24/18  4:35 PM  Result Value Ref Range Status   Specimen Description   Final    URINE, RANDOM Performed at [facility], [address] [place], [place] [address]    Special Requests   Final    NONE Performed at [facility], [address] [place], [place] [address]    Culture >=100,000 COLONIES/mL PSEUDOMONAS AERUGINOSA (A)  Final   Report Status 07/26/2018 FINAL  Final   Organism ID, Bacteria PSEUDOMONAS AERUGINOSA (A)  Final      Susceptibility   Pseudomonas aeruginosa - MIC*    CEFTAZIDIME 4 SENSITIVE Sensitive     CIPROFLOXACIN <=0.25 SENSITIVE Sensitive     GENTAMICIN <=1 SENSITIVE Sensitive     IMIPENEM 1 SENSITIVE Sensitive     PIP/TAZO 8 SENSITIVE Sensitive     CEFEPIME 2 SENSITIVE Sensitive     * >=100,000 COLONIES/mL PSEUDOMONAS AERUGINOSA     Labs: BNP (last 3 results) No results for input(s): BNP in the last 8760 hours. Basic Metabolic Panel: Recent Labs  Lab 07/24/18 0111 07/25/18 0409 07/26/18 0428  07/27/18 0324 07/28/18 0635  NA 146* 151* 151* 144 138  K 3.7 3.3* 3.4* 3.5 3.7  CL 116* 128* 130* 122* 112*  CO2 20* 16* 17* 16* 20*  GLUCOSE 108* 85 92 102* 94  BUN 49* 36* 26* 17 14  CREATININE 1.60* 1.07* 0.92 0.82 0.79  CALCIUM 9.3 8.3* 8.3* 8.2* 8.6*   Liver Function Tests: Recent Labs  Lab 07/24/18 0111  AST 56*  ALT 33  ALKPHOS 71  BILITOT 0.7  PROT 8.5*  ALBUMIN 2.9*   No results for input(s): LIPASE, AMYLASE in the last 168 hours. No results for input(s): AMMONIA in the last 168 hours. CBC: Recent Labs  Lab 07/24/18 0111 07/25/18 0409 07/26/18 0428 07/27/18 0324 07/28/18 0635  WBC 20.5* 14.6* 14.3* 15.6* 14.9*  NEUTROABS 17.3*  --  11.7* 12.5* 11.5*  HGB 10.9* 8.9* 8.5* 8.2* 9.5*  HCT 34.8* 28.2* 27.3* 26.8* 29.7*  MCV 96.1 95.9 96.5 95.7 94.3  PLT 209 208 230 222 235   Cardiac Enzymes: No results for input(s): CKTOTAL, CKMB, CKMBINDEX, TROPONINI in the last 168 hours. BNP: Invalid input(s): POCBNP CBG: No results for input(s): GLUCAP in the last 168 hours. D-Dimer No results for input(s): DDIMER in the last 72 hours. Hgb A1c No results for input(s): HGBA1C in the last 72 hours. Lipid Profile No results for input(s): CHOL, HDL, LDLCALC, TRIG, CHOLHDL, LDLDIRECT in the last 72 hours. Thyroid function studies No results for input(s): TSH, T4TOTAL, T3FREE, THYROIDAB in the last 72 hours.  Invalid input(s): FREET3 Anemia work up No results for input(s): VITAMINB12, FOLATE, FERRITIN, TIBC, IRON, RETICCTPCT in the last 72 hours. Urinalysis    Component Value Date/Time   COLORURINE YELLOW 07/24/2018 1635   APPEARANCEUR CLOUDY (A) 07/24/2018 1635   LABSPEC 1.021 07/24/2018 1635   PHURINE 5.0 07/24/2018 1635   GLUCOSEU NEGATIVE 07/24/2018 1635   HGBUR SMALL (A) 07/24/2018 1635   BILIRUBINUR NEGATIVE 07/24/2018 1635   KETONESUR NEGATIVE 07/24/2018 1635   PROTEINUR 30 (A) 07/24/2018 1635   NITRITE POSITIVE (A) 07/24/2018 1635   LEUKOCYTESUR  MODERATE (A) 07/24/2018 1635   Sepsis Labs Invalid input(s): PROCALCITONIN,  WBC,  LACTICIDVEN Microbiology Recent Results (from the past 240 hour(s))  Culture, blood (Routine x 2)     Status: None (Preliminary result)   Collection Time: 07/24/18 12:59 AM  Result Value Ref Range Status   Specimen Description   Final    BLOOD LEFT ANTECUBITAL Performed at [facility], [place] [address] [place], [place] [address]    Special Requests   Final    BOTTLES DRAWN AEROBIC AND ANAEROBIC Blood Culture adequate volume Performed at [place] [address] [place], [place] [address]    Culture   Final    NO GROWTH 4 DAYS Performed at [facility], [place] [address] [place], [place] [address]    Report Status PENDING  Incomplete  SARS Coronavirus 2 (CEPHEID- Performed in [place] hospital lab), Hosp Order     Status: None   Collection Time: 07/24/18 12:59 AM  Result Value Ref Range Status   SARS Coronavirus 2 NEGATIVE NEGATIVE Final    Comment: (NOTE) If result is NEGATIVE SARS-CoV-2 target nucleic acids are NOT DETECTED. The SARS-CoV-2 RNA is generally detectable in upper and lower  respiratory specimens during the acute phase of infection. The lowest  concentration of SARS-CoV-2 viral copies this assay can detect is 250  copies / mL. A negative result does not preclude SARS-CoV-2 infection  and should not be used as the sole basis for treatment or other  patient management decisions.  A negative result may occur with  improper specimen collection / handling, submission of specimen other  than nasopharyngeal swab, presence of viral mutation(s) within the  areas targeted by this assay, and inadequate number of viral copies  (<250 copies / mL). A negative result must be combined with clinical  observations, patient history, and epidemiological information. If result is POSITIVE SARS-CoV-2 target nucleic acids are  DETECTED. The SARS-CoV-2 RNA  is generally detectable in upper and lower  respiratory specimens dur ing the acute phase of infection.  Positive  results are indicative of active infection with SARS-CoV-2.  Clinical  correlation with patient history and other diagnostic information is  necessary to determine patient infection status.  Positive results do  not rule out bacterial infection or co-infection with other viruses. If result is PRESUMPTIVE POSTIVE SARS-CoV-2 nucleic acids MAY BE PRESENT.   A presumptive positive result was obtained on the submitted specimen  and confirmed on repeat testing.  While 2019 novel coronavirus  (SARS-CoV-2) nucleic acids may be present in the submitted sample  additional confirmatory testing may be necessary for epidemiological  and / or clinical management purposes  to differentiate between  SARS-CoV-2 and other Sarbecovirus currently known to infect humans.  If clinically indicated additional testing with an alternate test  methodology [phone]) is advised. The SARS-CoV-2 RNA is generally  detectable in upper and lower respiratory sp ecimens during the acute  phase of infection. The expected result is Negative. Fact Sheet for Patients:  BoilerBrush.com.cy Fact Sheet for Healthcare Providers: https://pope.com/ This test is not yet approved or cleared by the [place] FDA and has been authorized for detection and/or diagnosis of SARS-CoV-2 by FDA under an Emergency Use Authorization (EUA).  This EUA will remain in effect (meaning this test can be used) for the duration of the COVID-19 declaration under Section 564(b)(1) of the Act, 21 U.S.C. section 360bbb-3(b)(1), unless the authorization is terminated or revoked sooner. Performed at [facility], [address] [place], [place] [address]   MRSA PCR Screening     Status: None   Collection Time: 07/24/18  6:34 AM  Result Value Ref Range Status   MRSA by  PCR NEGATIVE NEGATIVE Final    Comment:        The GeneXpert MRSA Assay (FDA approved for NASAL specimens only), is one component of a comprehensive MRSA colonization surveillance program. It is not intended to diagnose MRSA infection nor to guide or monitor treatment for MRSA infections. Performed at [facility], [address] [place], [place] [address]   Culture, blood (routine x 2) Call MD if unable to obtain prior to antibiotics being given     Status: None (Preliminary result)   Collection Time: 07/24/18 10:20 AM  Result Value Ref Range Status   Specimen Description   Final    BLOOD RIGHT HAND Performed at [facility], [address] [place], [place] [address]    Special Requests   Final    BOTTLES DRAWN AEROBIC ONLY Blood Culture results may not be optimal due to an inadequate volume of blood received in culture bottles Performed at [facility], [address], [place], [place] [address]    Culture   Final    NO GROWTH 4 DAYS Performed at [facility], [address] [place], [place] [address]    Report Status PENDING  Incomplete  Urine Culture     Status: Abnormal   Collection Time: 07/24/18  4:35 PM  Result Value Ref Range Status   Specimen Description   Final    URINE, RANDOM Performed at [facility], [address] [place], [place] [address]    Special Requests   Final    NONE Performed at [facility], [address], [place], [place] [address]    Culture >=100,000 COLONIES/mL PSEUDOMONAS AERUGINOSA (A)  Final   Report Status 07/26/2018 FINAL  Final   Organism ID, Bacteria PSEUDOMONAS AERUGINOSA (A)  Final      Susceptibility   Pseudomonas aeruginosa - MIC*    CEFTAZIDIME 4 SENSITIVE Sensitive     CIPROFLOXACIN <=0.25 SENSITIVE Sensitive     GENTAMICIN <=1 SENSITIVE Sensitive     IMIPENEM 1 SENSITIVE Sensitive     PIP/TAZO 8 SENSITIVE Sensitive     CEFEPIME 2  SENSITIVE Sensitive     * >=100,000 COLONIES/mL PSEUDOMONAS AERUGINOSA    Please note: You were cared for by a hospitalist during your hospital stay. Once you are discharged, your primary care physician will handle any further medical issues. Please note that NO REFILLS for any discharge medications will be authorized once you are discharged, as it is imperative that you return to your primary care physician (or establish a relationship with a primary care physician if you do not have one) for your post hospital discharge needs so that they can reassess your need for medications and monitor your lab values.    Time coordinating discharge: 40 minutes  SIGNED:   Burnadette Pop, MD  [facility] 07/28/2018, 9:58 AM Pager [phone]  If 7PM-7AM, please contact night-coverage [email or web address] Password [email or web address]

## 2018-07-29 LAB — CULTURE, BLOOD (ROUTINE X 2)
Culture: NO GROWTH
Culture: NO GROWTH
Special Requests: ADEQUATE

## 2018-09-09 ENCOUNTER — Emergency Department (HOSPITAL_COMMUNITY): Payer: Medicare Other

## 2018-09-09 ENCOUNTER — Inpatient Hospital Stay (HOSPITAL_COMMUNITY): Payer: Medicare Other

## 2018-09-09 ENCOUNTER — Inpatient Hospital Stay (HOSPITAL_COMMUNITY)
Admission: EM | Admit: 2018-09-09 | Discharge: 2018-09-17 | DRG: 871 | Disposition: A | Discharge status: Hospice | Payer: Medicare Other | Attending: Internal Medicine | Admitting: Internal Medicine

## 2018-09-09 ENCOUNTER — Other Ambulatory Visit: Payer: Self-pay

## 2018-09-09 ENCOUNTER — Encounter (HOSPITAL_COMMUNITY): Payer: Self-pay | Admitting: Emergency Medicine

## 2018-09-09 ENCOUNTER — Inpatient Hospital Stay: Payer: Self-pay

## 2018-09-09 DIAGNOSIS — R197 Diarrhea, unspecified: Secondary | ICD-10-CM | POA: Diagnosis not present

## 2018-09-09 DIAGNOSIS — Z9981 Dependence on supplemental oxygen: Secondary | ICD-10-CM

## 2018-09-09 DIAGNOSIS — E869 Volume depletion, unspecified: Secondary | ICD-10-CM | POA: Diagnosis present

## 2018-09-09 DIAGNOSIS — Z20828 Contact with and (suspected) exposure to other viral communicable diseases: Secondary | ICD-10-CM | POA: Diagnosis present

## 2018-09-09 DIAGNOSIS — I129 Hypertensive chronic kidney disease with stage 1 through stage 4 chronic kidney disease, or unspecified chronic kidney disease: Secondary | ICD-10-CM | POA: Diagnosis present

## 2018-09-09 DIAGNOSIS — J9600 Acute respiratory failure, unspecified whether with hypoxia or hypercapnia: Secondary | ICD-10-CM | POA: Diagnosis present

## 2018-09-09 DIAGNOSIS — R401 Stupor: Secondary | ICD-10-CM

## 2018-09-09 DIAGNOSIS — R627 Adult failure to thrive: Secondary | ICD-10-CM | POA: Diagnosis present

## 2018-09-09 DIAGNOSIS — Z22322 Carrier or suspected carrier of Methicillin resistant Staphylococcus aureus: Secondary | ICD-10-CM

## 2018-09-09 DIAGNOSIS — F0390 Unspecified dementia without behavioral disturbance: Secondary | ICD-10-CM | POA: Diagnosis present

## 2018-09-09 DIAGNOSIS — Z515 Encounter for palliative care: Secondary | ICD-10-CM | POA: Diagnosis not present

## 2018-09-09 DIAGNOSIS — Z79899 Other long term (current) drug therapy: Secondary | ICD-10-CM

## 2018-09-09 DIAGNOSIS — J189 Pneumonia, unspecified organism: Secondary | ICD-10-CM | POA: Diagnosis not present

## 2018-09-09 DIAGNOSIS — R0902 Hypoxemia: Secondary | ICD-10-CM | POA: Diagnosis present

## 2018-09-09 DIAGNOSIS — G9341 Metabolic encephalopathy: Secondary | ICD-10-CM | POA: Diagnosis present

## 2018-09-09 DIAGNOSIS — Z66 Do not resuscitate: Secondary | ICD-10-CM | POA: Diagnosis present

## 2018-09-09 DIAGNOSIS — E870 Hyperosmolality and hypernatremia: Secondary | ICD-10-CM | POA: Diagnosis present

## 2018-09-09 DIAGNOSIS — G9340 Encephalopathy, unspecified: Secondary | ICD-10-CM | POA: Diagnosis not present

## 2018-09-09 DIAGNOSIS — Z7189 Other specified counseling: Secondary | ICD-10-CM | POA: Diagnosis not present

## 2018-09-09 DIAGNOSIS — J9601 Acute respiratory failure with hypoxia: Secondary | ICD-10-CM | POA: Diagnosis present

## 2018-09-09 DIAGNOSIS — J690 Pneumonitis due to inhalation of food and vomit: Secondary | ICD-10-CM | POA: Diagnosis present

## 2018-09-09 DIAGNOSIS — A419 Sepsis, unspecified organism: Secondary | ICD-10-CM | POA: Diagnosis present

## 2018-09-09 DIAGNOSIS — R6521 Severe sepsis with septic shock: Secondary | ICD-10-CM | POA: Diagnosis present

## 2018-09-09 DIAGNOSIS — E876 Hypokalemia: Secondary | ICD-10-CM | POA: Diagnosis present

## 2018-09-09 DIAGNOSIS — N179 Acute kidney failure, unspecified: Secondary | ICD-10-CM | POA: Diagnosis present

## 2018-09-09 DIAGNOSIS — I69321 Dysphasia following cerebral infarction: Secondary | ICD-10-CM

## 2018-09-09 DIAGNOSIS — Z4659 Encounter for fitting and adjustment of other gastrointestinal appliance and device: Secondary | ICD-10-CM

## 2018-09-09 DIAGNOSIS — R0602 Shortness of breath: Secondary | ICD-10-CM

## 2018-09-09 DIAGNOSIS — E872 Acidosis: Secondary | ICD-10-CM | POA: Diagnosis present

## 2018-09-09 LAB — BLOOD GAS, ARTERIAL
Acid-base deficit: 5.5 mmol/L — ABNORMAL HIGH (ref 0.0–2.0)
Bicarbonate: 18.2 mmol/L — ABNORMAL LOW (ref 20.0–28.0)
Drawn by: 441261
FIO2: 100
MECHVT: 510 mL
O2 Saturation: 99.4 %
PEEP: 5 cmH2O
Patient temperature: 98.6
RATE: 14 resp/min
pCO2 arterial: 31.4 mmHg — ABNORMAL LOW (ref 32.0–48.0)
pH, Arterial: 7.381 (ref 7.350–7.450)
pO2, Arterial: 346 mmHg — ABNORMAL HIGH (ref 83.0–108.0)

## 2018-09-09 LAB — URINALYSIS, ROUTINE W REFLEX MICROSCOPIC
Bilirubin Urine: NEGATIVE
Glucose, UA: NEGATIVE mg/dL
Ketones, ur: NEGATIVE mg/dL
Nitrite: NEGATIVE
Protein, ur: NEGATIVE mg/dL
Specific Gravity, Urine: 1.023 (ref 1.005–1.030)
WBC, UA: >50 WBC/hpf — ABNORMAL HIGH (ref 0–5)
pH: 5 (ref 5.0–8.0)

## 2018-09-09 LAB — BASIC METABOLIC PANEL
Anion gap: 17 — ABNORMAL HIGH (ref 5–15)
Anion gap: 12 (ref 5–15)
Anion gap: 13 (ref 5–15)
BUN: 46 mg/dL — ABNORMAL HIGH (ref 8–23)
BUN: 48 mg/dL — ABNORMAL HIGH (ref 8–23)
BUN: 46 mg/dL — ABNORMAL HIGH (ref 8–23)
CO2: 14 mmol/L — ABNORMAL LOW (ref 22–32)
CO2: 19 mmol/L — ABNORMAL LOW (ref 22–32)
CO2: 18 mmol/L — ABNORMAL LOW (ref 22–32)
Calcium: 9.5 mg/dL (ref 8.9–10.3)
Calcium: 9.4 mg/dL (ref 8.9–10.3)
Calcium: 8.9 mg/dL (ref 8.9–10.3)
Chloride: 126 mmol/L — ABNORMAL HIGH (ref 98–111)
Chloride: 127 mmol/L — ABNORMAL HIGH (ref 98–111)
Chloride: 125 mmol/L — ABNORMAL HIGH (ref 98–111)
Creatinine, Ser: 1.43 mg/dL — ABNORMAL HIGH (ref 0.44–1.00)
Creatinine, Ser: 1.53 mg/dL — ABNORMAL HIGH (ref 0.44–1.00)
Creatinine, Ser: 1.36 mg/dL — ABNORMAL HIGH (ref 0.44–1.00)
GFR calc Af Amer: 40 mL/min — ABNORMAL LOW (ref >60)
GFR calc Af Amer: 37 mL/min — ABNORMAL LOW (ref >60)
GFR calc Af Amer: 42 mL/min — ABNORMAL LOW (ref >60)
GFR calc non Af Amer: 34 mL/min — ABNORMAL LOW (ref >60)
GFR calc non Af Amer: 32 mL/min — ABNORMAL LOW (ref >60)
GFR calc non Af Amer: 36 mL/min — ABNORMAL LOW (ref >60)
Glucose, Bld: 130 mg/dL — ABNORMAL HIGH (ref 70–99)
Glucose, Bld: 144 mg/dL — ABNORMAL HIGH (ref 70–99)
Glucose, Bld: 153 mg/dL — ABNORMAL HIGH (ref 70–99)
Potassium: 4.9 mmol/L (ref 3.5–5.1)
Potassium: 3.8 mmol/L (ref 3.5–5.1)
Potassium: 3.2 mmol/L — ABNORMAL LOW (ref 3.5–5.1)
Sodium: 157 mmol/L — ABNORMAL HIGH (ref 135–145)
Sodium: 158 mmol/L — ABNORMAL HIGH (ref 135–145)
Sodium: 156 mmol/L — ABNORMAL HIGH (ref 135–145)

## 2018-09-09 LAB — CBC WITH DIFFERENTIAL/PLATELET
Abs Immature Granulocytes: 0.1 10*3/uL — ABNORMAL HIGH (ref 0.00–0.07)
Basophils Absolute: 0.1 10*3/uL (ref 0.0–0.1)
Basophils Relative: 0 %
Eosinophils Absolute: 0 10*3/uL (ref 0.0–0.5)
Eosinophils Relative: 0 %
HCT: 50.2 % — ABNORMAL HIGH (ref 36.0–46.0)
Hemoglobin: 14.7 g/dL (ref 12.0–15.0)
Immature Granulocytes: 1 %
Lymphocytes Relative: 10 %
Lymphs Abs: 1.7 10*3/uL (ref 0.7–4.0)
MCH: 30.1 pg (ref 26.0–34.0)
MCHC: 29.3 g/dL — ABNORMAL LOW (ref 30.0–36.0)
MCV: 102.9 fL — ABNORMAL HIGH (ref 80.0–100.0)
Monocytes Absolute: 1.2 10*3/uL — ABNORMAL HIGH (ref 0.1–1.0)
Monocytes Relative: 7 %
Neutro Abs: 14.7 10*3/uL — ABNORMAL HIGH (ref 1.7–7.7)
Neutrophils Relative %: 82 %
Platelets: 208 10*3/uL (ref 150–400)
RBC: 4.88 MIL/uL (ref 3.87–5.11)
RDW: 19.4 % — ABNORMAL HIGH (ref 11.5–15.5)
WBC: 17.7 10*3/uL — ABNORMAL HIGH (ref 4.0–10.5)
nRBC: 0.5 % — ABNORMAL HIGH (ref 0.0–0.2)

## 2018-09-09 LAB — COMPREHENSIVE METABOLIC PANEL
ALT: 10 U/L (ref 0–44)
AST: 17 U/L (ref 15–41)
Albumin: 3.6 g/dL (ref 3.5–5.0)
Alkaline Phosphatase: 52 U/L (ref 38–126)
Anion gap: 16 — ABNORMAL HIGH (ref 5–15)
BUN: 46 mg/dL — ABNORMAL HIGH (ref 8–23)
CO2: 21 mmol/L — ABNORMAL LOW (ref 22–32)
Calcium: 10.9 mg/dL — ABNORMAL HIGH (ref 8.9–10.3)
Chloride: 124 mmol/L — ABNORMAL HIGH (ref 98–111)
Creatinine, Ser: 1.45 mg/dL — ABNORMAL HIGH (ref 0.44–1.00)
GFR calc Af Amer: 39 mL/min — ABNORMAL LOW (ref >60)
GFR calc non Af Amer: 34 mL/min — ABNORMAL LOW (ref >60)
Glucose, Bld: 110 mg/dL — ABNORMAL HIGH (ref 70–99)
Potassium: 4.1 mmol/L (ref 3.5–5.1)
Sodium: 161 mmol/L (ref 135–145)
Total Bilirubin: 0.6 mg/dL (ref 0.3–1.2)
Total Protein: 8.7 g/dL — ABNORMAL HIGH (ref 6.5–8.1)

## 2018-09-09 LAB — LACTIC ACID, PLASMA: Lactic Acid, Venous: 4.4 mmol/L (ref 0.5–1.9)

## 2018-09-09 LAB — PHOSPHORUS
Phosphorus: 3.5 mg/dL (ref 2.5–4.6)
Phosphorus: 2.5 mg/dL (ref 2.5–4.6)

## 2018-09-09 LAB — MRSA PCR SCREENING: MRSA by PCR: POSITIVE — AB

## 2018-09-09 LAB — GLUCOSE, CAPILLARY
Glucose-Capillary: 98 mg/dL (ref 70–99)
Glucose-Capillary: 142 mg/dL — ABNORMAL HIGH (ref 70–99)

## 2018-09-09 LAB — SARS CORONAVIRUS 2 BY RT PCR (HOSPITAL ORDER, PERFORMED IN ~~LOC~~ HOSPITAL LAB): SARS Coronavirus 2: NEGATIVE

## 2018-09-09 LAB — BRAIN NATRIURETIC PEPTIDE: B Natriuretic Peptide: 66.3 pg/mL (ref 0.0–100.0)

## 2018-09-09 LAB — MAGNESIUM
Magnesium: 2.4 mg/dL (ref 1.7–2.4)
Magnesium: 2.2 mg/dL (ref 1.7–2.4)

## 2018-09-09 LAB — PROCALCITONIN: Procalcitonin: 1.61 ng/mL

## 2018-09-09 MED ORDER — PIPERACILLIN-TAZOBACTAM 3.375 G IVPB 30 MIN
3.3750 g | Freq: Once | INTRAVENOUS | Status: AC
  Administered 2018-09-09: 3.375 g via INTRAVENOUS
  Filled 2018-09-09 (×2): qty 50

## 2018-09-09 MED ORDER — PIPERACILLIN-TAZOBACTAM 3.375 G IVPB
3.3750 g | Freq: Three times a day (TID) | INTRAVENOUS | Status: DC
  Administered 2018-09-09 – 2018-09-16 (×20): 3.375 g via INTRAVENOUS
  Filled 2018-09-09 (×19): qty 50

## 2018-09-09 MED ORDER — ROCURONIUM BROMIDE 50 MG/5ML IV SOLN
100.0000 mg | Freq: Once | INTRAVENOUS | Status: AC
  Administered 2018-09-09: 100 mg via INTRAVENOUS
  Filled 2018-09-09: qty 10

## 2018-09-09 MED ORDER — SODIUM CHLORIDE 0.9 % IV SOLN
2.0000 g | Freq: Once | INTRAVENOUS | Status: AC
  Administered 2018-09-09: 2 g via INTRAVENOUS
  Filled 2018-09-09: qty 2

## 2018-09-09 MED ORDER — CHLORHEXIDINE GLUCONATE CLOTH 2 % EX PADS
6.0000 | MEDICATED_PAD | Freq: Every day | CUTANEOUS | Status: DC
  Administered 2018-09-09 – 2018-09-10 (×2): 6 via TOPICAL

## 2018-09-09 MED ORDER — MUPIROCIN 2 % EX OINT
1.0000 "application " | TOPICAL_OINTMENT | Freq: Two times a day (BID) | CUTANEOUS | Status: AC
  Administered 2018-09-09 – 2018-09-14 (×10): 1 via NASAL
  Filled 2018-09-09: qty 22

## 2018-09-09 MED ORDER — SODIUM CHLORIDE 0.9 % IV BOLUS
1000.0000 mL | Freq: Once | INTRAVENOUS | Status: AC
  Administered 2018-09-09: 1000 mL via INTRAVENOUS

## 2018-09-09 MED ORDER — NOREPINEPHRINE 4 MG/250ML-% IV SOLN
2.0000 ug/min | INTRAVENOUS | Status: DC
  Administered 2018-09-09: 2 ug/min via INTRAVENOUS
  Filled 2018-09-09: qty 250

## 2018-09-09 MED ORDER — VITAL HIGH PROTEIN PO LIQD
1000.0000 mL | ORAL | Status: DC
  Administered 2018-09-09: 1000 mL

## 2018-09-09 MED ORDER — ORAL CARE MOUTH RINSE
15.0000 mL | OROMUCOSAL | Status: DC
  Administered 2018-09-09 – 2018-09-11 (×19): 15 mL via OROMUCOSAL

## 2018-09-09 MED ORDER — VANCOMYCIN HCL IN DEXTROSE 1-5 GM/200ML-% IV SOLN
1000.0000 mg | INTRAVENOUS | Status: DC
  Administered 2018-09-10: 1000 mg via INTRAVENOUS
  Filled 2018-09-09: qty 200

## 2018-09-09 MED ORDER — SODIUM CHLORIDE 0.9% FLUSH: 10.0000 mL | INTRAVENOUS | Status: DC | PRN

## 2018-09-09 MED ORDER — CHLORHEXIDINE GLUCONATE CLOTH 2 % EX PADS
6.0000 | MEDICATED_PAD | Freq: Every day | CUTANEOUS | Status: DC
  Administered 2018-09-09 – 2018-09-17 (×6): 6 via TOPICAL

## 2018-09-09 MED ORDER — SODIUM CHLORIDE 0.9% FLUSH
10.0000 mL | Freq: Two times a day (BID) | INTRAVENOUS | Status: DC
  Administered 2018-09-10: 10 mL
  Administered 2018-09-10: 40 mL
  Administered 2018-09-10: 30 mL
  Administered 2018-09-11 – 2018-09-12 (×4): 10 mL
  Administered 2018-09-13: 40 mL
  Administered 2018-09-16: 10 mL

## 2018-09-09 MED ORDER — PRO-STAT SUGAR FREE PO LIQD
30.0000 mL | Freq: Two times a day (BID) | ORAL | Status: DC
  Administered 2018-09-09 (×2): 30 mL
  Filled 2018-09-09 (×2): qty 30

## 2018-09-09 MED ORDER — CHLORHEXIDINE GLUCONATE 0.12% ORAL RINSE (MEDLINE KIT)
15.0000 mL | Freq: Two times a day (BID) | OROMUCOSAL | Status: DC
  Administered 2018-09-09 – 2018-09-11 (×4): 15 mL via OROMUCOSAL

## 2018-09-09 MED ORDER — LACTATED RINGERS IV BOLUS
500.0000 mL | Freq: Once | INTRAVENOUS | Status: AC
  Administered 2018-09-09: 500 mL via INTRAVENOUS

## 2018-09-09 MED ORDER — PHENYLEPHRINE HCL-NACL 10-0.9 MG/250ML-% IV SOLN
0.0000 ug/min | INTRAVENOUS | Status: DC
  Administered 2018-09-09 – 2018-09-10 (×3): 20 ug/min via INTRAVENOUS
  Filled 2018-09-09 (×2): qty 250

## 2018-09-09 MED ORDER — ETOMIDATE 2 MG/ML IV SOLN
15.0000 mg | Freq: Once | INTRAVENOUS | Status: AC
  Administered 2018-09-09: 15 mg via INTRAVENOUS

## 2018-09-09 MED ORDER — HEPARIN SODIUM (PORCINE) 5000 UNIT/ML IJ SOLN
5000.0000 [IU] | Freq: Three times a day (TID) | INTRAMUSCULAR | Status: DC
  Administered 2018-09-09 – 2018-09-16 (×21): 5000 [IU] via SUBCUTANEOUS
  Filled 2018-09-09 (×22): qty 1

## 2018-09-09 MED ORDER — VANCOMYCIN HCL IN DEXTROSE 1-5 GM/200ML-% IV SOLN
1000.0000 mg | Freq: Once | INTRAVENOUS | Status: AC
  Administered 2018-09-09: 1000 mg via INTRAVENOUS
  Filled 2018-09-09: qty 200

## 2018-09-09 MED ORDER — FENTANYL CITRATE (PF) 100 MCG/2ML IJ SOLN: 50.0000 ug | INTRAMUSCULAR | Status: DC | PRN

## 2018-09-09 MED ORDER — LACTATED RINGERS IV SOLN
INTRAVENOUS | Status: DC
  Administered 2018-09-09 – 2018-09-16 (×8): via INTRAVENOUS

## 2018-09-09 MED ORDER — MIDAZOLAM HCL 2 MG/2ML IJ SOLN: 1.0000 mg | INTRAMUSCULAR | Status: DC | PRN

## 2018-09-09 MED ORDER — FAMOTIDINE IN NACL 20-0.9 MG/50ML-% IV SOLN
20.0000 mg | Freq: Two times a day (BID) | INTRAVENOUS | Status: DC
  Administered 2018-09-09 – 2018-09-16 (×15): 20 mg via INTRAVENOUS
  Filled 2018-09-09 (×15): qty 50

## 2018-09-09 MED ORDER — METRONIDAZOLE IN NACL 5-0.79 MG/ML-% IV SOLN
500.0000 mg | Freq: Once | INTRAVENOUS | Status: AC
  Administered 2018-09-09: 500 mg via INTRAVENOUS
  Filled 2018-09-09: qty 100

## 2018-09-09 MED ORDER — LACTATED RINGERS IV BOLUS
250.0000 mL | Freq: Once | INTRAVENOUS | Status: AC
  Administered 2018-09-09: 14:00:00 250 mL via INTRAVENOUS

## 2018-09-09 NOTE — ED Notes (Signed)
Called Cherisse, pt's niece to give update that patient is being transferred to ICU room 1225.

## 2018-09-09 NOTE — ED Provider Notes (Signed)
Emergency Department Provider Note   I have reviewed the triage vital signs and the nursing notes.   HISTORY  Chief Complaint Aspiration   HPI Taylor Ruiz is a 83 y.o. female with PMH of dementia, HTN, and frequent PNA who presents to the emergency department for evaluation of acute onset altered mental status, hypoxemia, increased work of breathing.  Patient was found by staff at her facility at approximately 6 AM with minimal responsiveness.  She was found to be hypoxemic and EMS was called.  She has had prior episodes of aspiration and is on swallowing precautions from a prior stroke.  EMS started oxygen and presented to the emergency department immediately.   Level 5 caveat: AMS and Dementia  Past Medical History:  Diagnosis Date   Dementia ([place])    HTN (hypertension)    Vertigo     Patient Active Problem List   Diagnosis Date Noted   Pneumonia 07/24/2018   Pressure injury of skin 07/24/2018   HCAP (healthcare-associated pneumonia) 07/24/2018   AKI (acute kidney injury) ([place]) 07/24/2018   FTT (failure to thrive) in adult 07/24/2018   HTN (hypertension) 64/40/3474   Acute metabolic encephalopathy 25/95/6387   Syncope 06/24/2018   Dementia ([place]) 06/24/2018   Mild renal insufficiency 06/24/2018   Hypokalemia 06/24/2018   Elevated troponin 06/24/2018    History reviewed. No pertinent surgical history.  Allergies Patient has no known allergies.  No family history on file.  Social History Social History   Tobacco Use   Smoking status: Never Smoker   Smokeless tobacco: Never Used   Tobacco comment: unsure if ever smoked or used smokeless tobacco  Substance Use Topics   Alcohol use: Not on file   Drug use: Not on file    Review of Systems  Level 5 caveat: AMS  ____________________________________________   PHYSICAL EXAM:  VITAL SIGNS: ED Triage Vitals  Enc Vitals Group     BP 09/09/18 0651 101/69     Pulse Rate  09/09/18 0651 (!) 130     Resp 09/09/18 0651 (!) 35     Temp 09/09/18 0651 97.7 F (36.5 C)     Temp Source 09/09/18 0651 Oral     SpO2 09/09/18 0649 97 %   Constitutional: Somnolent. Increased WOB on exam.  Eyes: Conjunctivae are normal.  Head: Atraumatic. Nose: No congestion/rhinnorhea. Mouth/Throat: Mucous membranes are moist.  Neck: No stridor.  Cardiovascular: Tachycardia. Good peripheral circulation. Grossly normal heart sounds.   Respiratory: Increased respiratory effort.  No retractions. Lungs with rhonchi throughout.  Gastrointestinal: No distention.  Musculoskeletal: No gross deformities of extremities. Neurologic: Somnolent but awakens to voice.  Skin:  Skin is warm, dry and intact. No rash noted.  ____________________________________________   LABS (all labs ordered are listed, but only abnormal results are displayed)  Labs Reviewed  CBC WITH DIFFERENTIAL/PLATELET - Abnormal; Notable for the following components:      Result Value   WBC 17.7 (*)    HCT 50.2 (*)    MCV 102.9 (*)    MCHC 29.3 (*)    RDW 19.4 (*)    nRBC 0.5 (*)    Neutro Abs 14.7 (*)    Monocytes Absolute 1.2 (*)    Abs Immature Granulocytes 0.10 (*)    All other components within normal limits  COMPREHENSIVE METABOLIC PANEL - Abnormal; Notable for the following components:   Sodium 161 (*)    Chloride 124 (*)    CO2 21 (*)    Glucose,  Bld 110 (*)    BUN 46 (*)    Creatinine, Ser 1.45 (*)    Calcium 10.9 (*)    Total Protein 8.7 (*)    GFR calc non Af Amer 34 (*)    GFR calc Af Amer 39 (*)    Anion gap 16 (*)    All other components within normal limits  CULTURE, BLOOD (ROUTINE X 2)  CULTURE, BLOOD (ROUTINE X 2)  SARS CORONAVIRUS 2 (HOSPITAL ORDER, PERFORMED IN [facility])  BRAIN NATRIURETIC PEPTIDE  URINALYSIS, ROUTINE W REFLEX MICROSCOPIC  BLOOD GAS, ARTERIAL   ____________________________________________  EKG   EKG Interpretation  Date/Time:  Monday September 09 2018 06:50:58 EDT Ventricular Rate:  128 PR Interval:    QRS Duration: 77 QT Interval:  322 QTC Calculation: 470 R Axis:   77 Text Interpretation:  Sinus tachycardia Consider right atrial enlargement ST depr, consider ischemia, inferior leads No STEMI. ST depressions likely rate related.  Confirmed by Alona BeneLong, Breauna Mazzeo [phone]([id number]) on 09/09/2018 7:28:35 AM Also confirmed by Alona BeneLong, Jaryah Aracena [phone]), editor Barbette Hairassel, Kerry [phone])  on 09/09/2018 8:22:01 AM       ____________________________________________  RADIOLOGY  Dg Chest Portable 1 View  Result Date: 09/09/2018 CLINICAL DATA:  Respiratory distress. EXAM: PORTABLE CHEST 1 VIEW COMPARISON:  07/24/2018. FINDINGS: Mediastinum is normal. Persistent right mid and lower lung infiltrates, similar findings noted on prior exam. No pleural effusion or pneumothorax. Heart size normal. Thoracic spine scoliosis. IMPRESSION: Persistent right mid and lower lung infiltrates, similar findings noted on prior exam. Electronically Signed   By: Maisie Fushomas  Register   On: 09/09/2018 07:56    ____________________________________________   PROCEDURES  Procedure(s) performed:   Procedure Name: Intubation Date/Time: 09/09/2018 8:09 AM Performed by: Maia PlanLong, Atzel Mccambridge G, MD Pre-anesthesia Checklist: Patient identified, Emergency Drugs available, Suction available, Patient being monitored and Timeout performed Oxygen Delivery Method: Non-rebreather mask Preoxygenation: Pre-oxygenation with 100% oxygen Induction Type: Rapid sequence Laryngoscope Size: Glidescope and 3 Grade View: Grade II Tube size: 7.5 mm Number of attempts: 1 Airway Equipment and Method: Video-laryngoscopy Placement Confirmation: ETT inserted through vocal cords under direct vision,  CO2 detector and Breath sounds checked- equal and bilateral Secured at: 22 cm Tube secured with: ETT holder Dental Injury: Teeth and Oropharynx as per pre-operative assessment     .Critical Care Performed by: Maia PlanLong, Marianna Cid  G, MD Authorized by: Maia PlanLong, Carri Spillers G, MD   Critical care provider statement:    Critical care time (minutes):  75   Critical care time was exclusive of:  Separately billable procedures and treating other patients and teaching time   Critical care was necessary to treat or prevent imminent or life-threatening deterioration of the following conditions:  Respiratory failure and CNS failure or compromise   Critical care was time spent personally by me on the following activities:  Blood draw for specimens, development of treatment plan with patient or surrogate, discussions with consultants, evaluation of patient's response to treatment, examination of patient, obtaining history from patient or surrogate, ordering and performing treatments and interventions, ordering and review of laboratory studies, ordering and review of radiographic studies, pulse oximetry, re-evaluation of patient's condition, review of old charts and ventilator management   I assumed direction of critical care for this patient from another provider in my specialty: no     ____________________________________________   INITIAL IMPRESSION / ASSESSMENT AND PLAN / ED COURSE  Pertinent labs & imaging results that were available during my care of the patient were reviewed by  me and considered in my medical decision making (see chart for details).   Patient presents to the emergency department with altered mental status, hypoxemia, increased work of breathing.  Suspect aspiration event.  Lower suspicion for COVID has no report of illness prior to this.  Afebrile here but requiring nonrebreather at this time.  I discussed the case with the patient's emergency contact listed in epic.  She advises that the patient continues to be Full Code.   08:00 AM  Patient successfully intubated.  Her mental status and oxygenation were both poor on nonrebreather.  Labs starting to come back showing significant hypernatremia, AKI, leukocytosis.  Chest  x-ray prior to intubation shows persistent infiltrate which is similar to prior exams.  COVID is pending.   08:15 AM  Discussed patient's case with Critical Care to request admission. Patient and family (if present) updated with plan.  I reviewed all nursing notes, vitals, pertinent old records, EKGs, labs, imaging (as available).  COVID is negative. At this time, feel that hypoxemia and hypernatremia explain the patient's mental status. No clear, focal findings on initial exam to suspect CVA or bleed. ABG reviewed. Attempted to call and update family again but unsuccessful.  ____________________________________________  FINAL CLINICAL IMPRESSION(S) / ED DIAGNOSES  Final diagnoses:  Obtundation  Hypoxemia  SOB (shortness of breath)  Hypernatremia     MEDICATIONS GIVEN DURING THIS VISIT:  Medications  vancomycin (VANCOCIN) IVPB 1000 mg/200 mL premix (has no administration in time range)  ceFEPIme (MAXIPIME) 2 g in sodium chloride 0.9 % 100 mL IVPB (2 g Intravenous New Bag/Given 09/09/18 0820)  metroNIDAZOLE (FLAGYL) IVPB 500 mg (has no administration in time range)  fentaNYL (SUBLIMAZE) injection 50 mcg (has no administration in time range)  fentaNYL (SUBLIMAZE) injection 50 mcg (has no administration in time range)  midazolam (VERSED) injection 1 mg (has no administration in time range)  midazolam (VERSED) injection 1 mg (has no administration in time range)  etomidate (AMIDATE) injection 15 mg (15 mg Intravenous Given by Other 09/09/18 0757)  rocuronium (ZEMURON) injection 100 mg (100 mg Intravenous Given 09/09/18 0757)  sodium chloride 0.9 % bolus 1,000 mL (1,000 mLs Intravenous New Bag/Given 09/09/18 0816)     Note:  This document was prepared using Dragon voice recognition software and may include unintentional dictation errors.  Alona BeneJoshua Wilfred Siverson, MD Emergency Medicine    Arma Reining, Arlyss RepressJoshua G, MD 09/09/18 [phone]

## 2018-09-09 NOTE — ED Notes (Signed)
OG tube attempted x2 unsuccessful; provider aware.

## 2018-09-09 NOTE — Progress Notes (Signed)
Spoke at length w/ nephew MR Winford Bethea. I advised him of current findings, diagnosis, treatment plan and options going forward. I think from a critical care stand-point she is already on the mechanical ventilator and vasoactive gtts. He understood this AND also anticipated we would need to have a discussion re: goals of care and had already began reaching out to other family members. I discussed w/ him the likelihood that this could happen again in the future AND the invasive level of support we have already initiated. I have recommended that we continue current level of care including: mechanical ventilation, pressors w/ ceiling, IVFs and antibiotics. However should she decline in spite of this we would NOT offer CPR.  He seemed receptive to this but wanted to discuss it further w/ the rest of his family. We will plan on discussing this again on 7/21 between 8 and 10 am.   For now pt remains full code w/ aggressive treatment.  Plan will be to discuss limits to level of support and DNR status again in am   Erick Colace ACNP-BC [place] Pager # [phone] OR # [phone] if no answer

## 2018-09-09 NOTE — ED Notes (Signed)
Two RNs attempted foley catheter attempts with no success.

## 2018-09-09 NOTE — Progress Notes (Signed)
Peripherally Inserted Central Catheter/Midline Placement  The IV Nurse has discussed with the patient and/or persons authorized to consent for the patient, the purpose of this procedure and the potential benefits and risks involved with this procedure.  The benefits include less needle sticks, lab draws from the catheter, and the patient may be discharged home with the catheter. Risks include, but not limited to, infection, bleeding, blood clot (thrombus formation), and puncture of an artery; nerve damage and irregular heartbeat and possibility to perform a PICC exchange if needed/ordered by physician.  Alternatives to this procedure were also discussed.  Bard Power PICC patient education guide, fact sheet on infection prevention and patient information card has been provided to patient /or left at bedside.    PICC/Midline Placement Documentation  PICC Triple Lumen 09/09/18 PICC Right Brachial 38 cm 0 cm (Active)  Indication for Insertion or Continuance of Line Vasoactive infusions 09/09/18 1919  Exposed Catheter (cm) 0 cm 09/09/18 1919  Site Assessment Clean;Dry;Intact 09/09/18 1919  Lumen #1 Status Flushed;Saline locked;Blood return noted 09/09/18 1919  Lumen #2 Status Flushed;Saline locked;Blood return noted 09/09/18 1919  Lumen #3 Status Flushed;Saline locked;Blood return noted 09/09/18 1919  Dressing Type Transparent 09/09/18 1919  Dressing Status Clean;Dry;Intact;Antimicrobial disc in place 09/09/18 1919  Dressing Change Due 09/16/18 09/09/18 1919       Taylor Ruiz 09/09/2018, 7:20 PM

## 2018-09-09 NOTE — ED Notes (Signed)
LR 730ml bolus started per verbal order given by Critical Care

## 2018-09-09 NOTE — ED Notes (Addendum)
0757: 15 mg of etomidate then 100 mg of rocuronium 0758: intubation CO2 detector color change, bilateral breath sounds, symmetrical chest rise, portable xray in route

## 2018-09-09 NOTE — ED Triage Notes (Signed)
Per EMS - Facility found pt approx 0600, unresponsive per baseline in respiratory distress with thoughts of possible aspiration. Pt is on swallowing precautions due to previous stroke . Pt was seen last approx 0400 at baseline. EMS states pt has coarse rhonchi in all fields. Sat low 80's when EMS arrived. On 10L @ 97%.   Alzheimer pt, non-verbal.     104 72 97% on 10L  124 HR   CBG 109 98.1

## 2018-09-09 NOTE — H&P (Addendum)
NAME:  Gilman Buttneratricia G. Rantz, MRN:  [id number], DOB:  06/22/1935, LOS: 0 ADMISSION DATE:  09/09/2018, CONSULTATION DATE:  09/09/2018 REFERRING MD:  Dr. Alona BeneJoshua Long, CHIEF COMPLAINT:  Aspiration  Brief History   83 year old female with past medical history significant for dementia and hx of aspiration presented for hypoxemia, increased work of breathing, and altered mental status.    History of present illness   83 year old female with past medical history significant for dementia, hypertension, hx failure to thrive in adult, and mild renal insufficiency, who presented to the ED with acute onset of altered mental status, hypoxemia, and increased work of breathing.  Patient was found by staff at her facility at approximately 0600 with minimal responsiveness. Patient has had prior episodes of aspiration and is on swallowing precautions from a prior stroke. EMS found her to be hypoxemic and started on oxygen.  Of note, patient recently hospitalized June 2-7, 2020, for acute metabolic encephalopathy and HCAP.  ED labs are as following: Sodium 161, Creatinine 1.4, WBC 17.7, and chest x-ray shows persistent infiltrates which is similar to prior exams. COVID negative.  Patient intubated due to hypoxemia. Low BP after induction, 1L NS and 750 ml LR bolus ordered. [facility]     Past Medical History  Dementia, Hypertension, Vertigo, Hx of failure to thrive, Mild renal insufficiency, Stroke   Significant Hospital Events   7/20 Admit, intubated, volume resuscitated. ABX started  Consults:    Procedures:  ETT 7/20 >>  Significant Diagnostic Tests:    Micro Data:  7/20 SARS-CoV-2: Negative 7/20 Blood Cx >> 7/20 Urine Cx >> 7/20 resp culture >>> Antimicrobials:  Zosyn 7/20>>> vanc 7/20>>>  Interim history/subjective:  Hypotensive in ER but responding to volume (may have been element of sedation/induction agent contributing as well)  Objective   Blood pressure 104/76, pulse  (Abnormal) 110, temperature 97.7 F (36.5 C), temperature source Oral, resp. rate 15, height 5\' 8"  (1.727 m), SpO2 100 %.    Vent Mode: PRVC FiO2 (%):  [50 %-100 %] 50 % Set Rate:  [14 bmp] 14 bmp Vt Set:  [510 mL] 510 mL PEEP:  [5 cmH20] 5 cmH20   Intake/Output Summary (Last 24 hours) at 09/09/2018 1052 Last data filed at 09/09/2018 1006 Gross per 24 hour  Intake 1193.93 ml  Output no documentation  Net 1193.93 ml   There were no vitals filed for this visit.  Examination: General: 83 year old frail woman HENT: [place]/AT, PERRLA intact Lungs: Cardiovascular: S1, S2, RRR, no murmurs, rubs, or gallops Abdomen: Soft, nontender, + bs Extremities: Warm, dry, palpable pulses, no edema Neuro: does not follow commands, withdraws to pain in all extremities GU:   7/20 CXR: ETT in position. Right mid and lower lobe infiltrates.    Resolved Hospital Problem list     Assessment & Plan:   Acute Hypoxemia Respiratory Failure/ Aspiration pneumonitis vs pneumonia  7/20 CXR R mid and lower lobe infiltrates Plan:  VAP bundle  Wean ventilatory settings, as tolerated PAD protocol (RASS goal 0 to -1) Repeat CXR in am  Sputum culture  Start on empirical abx (HCAP w/ aspiration coverage) Check baseline procalcitonin  Aspiration precautions s/p extubation We will need to discuss goals of care once again w/ family   Acute metabolic encephalopathy on dementia  Plan: Hold home meds: Rexulti and Aricept, until more stable  SIRS/Sepsis w/ septic shock w/ element of volume depletion Post- induction meds likely complicating hemodynamics as well Plan:  Hold home meds: Lopressor, amlodipine until BP normalizes Complete 2030ml/kg crystalloid bolus (complete 2 liters total) Ck lactic acid s/p completion of volume resuscitation (trend if elevated) May need vasoactive gtt (may start out w/ peripheral NE if needed)  abx as above Trend cbc  Anion gap metabolic acidosis Plan Check LA and trend if neg    Acute Kidney Injury Plan: Monitor Cr and urine output Hold Lyrica  Avoid nephrotoxic medications  Fluid and Electrolyte Imbalance: Hypernatremia Free water deficit: ~ 4.6 L Plan:  Repeating BMP now and plan on trending q 4 Once volume resuscitated will correct water imbalance (aiming for 1/2 the current deficit but not to correct Na more than 7 in 24 hrs ->starting at 161)  Best practice:  Diet: NPO Pain/Anxiety/Delirium protocol (if indicated): 7/20 initiated VAP protocol (if indicated): 7/20 initiated  DVT prophylaxis: SQ heparin GI prophylaxis: H2B Glucose control: na Mobility: Bed rest Code Status: FULL Family Communication: Unable to contact Disposition: ICU   Patient is critically ill, requiring CCM service for BP management, mechanical ventilatory management, fluid and electrolyte imbalances.   Labs   CBC: Recent Labs  Lab 09/09/18 0704  WBC 17.7*  NEUTROABS 14.7*  HGB 14.7  HCT 50.2*  MCV 102.9*  PLT 208    Basic Metabolic Panel: Recent Labs  Lab 09/09/18 0704  NA 161*  K 4.1  CL 124*  CO2 21*  GLUCOSE 110*  BUN 46*  CREATININE 1.45*  CALCIUM 10.9*   GFR: CrCl cannot be calculated (Unknown ideal weight.). Recent Labs  Lab 09/09/18 0704  WBC 17.7*    Liver Function Tests: Recent Labs  Lab 09/09/18 0704  AST 17  ALT 10  ALKPHOS 52  BILITOT 0.6  PROT 8.7*  ALBUMIN 3.6   No results for input(s): LIPASE, AMYLASE in the last 168 hours. No results for input(s): AMMONIA in the last 168 hours.  ABG    Component Value Date/Time   PHART 7.381 09/09/2018 0903   PCO2ART 31.4 (L) 09/09/2018 0903   PO2ART 346 (H) 09/09/2018 0903   HCO3 18.2 (L) 09/09/2018 0903   ACIDBASEDEF 5.5 (H) 09/09/2018 0903   O2SAT 99.4 09/09/2018 0903     Coagulation Profile: No results for input(s): INR, PROTIME in the last 168 hours.  Cardiac Enzymes: No results for input(s): CKTOTAL, CKMB, CKMBINDEX, TROPONINI in the last 168 hours.  HbA1C: No results  found for: HGBA1C  CBG: No results for input(s): GLUCAP in the last 168 hours.  Review of Systems:   Unable to obtain due to intubation and mental status  Past Medical History  She,  has a past medical history of Dementia (HCC), HTN (hypertension), and Vertigo.   Surgical History   History reviewed. No pertinent surgical history.   Social History   reports that she has never smoked. She has never used smokeless tobacco.   Family History   Her family history is not on file.   Allergies No Known Allergies   Home Medications  Prior to Admission medications   Medication Sig Start Date End Date Taking? Authorizing Provider  acetaminophen (TYLENOL) 325 MG tablet Take 650 mg by mouth every 6 (six) hours as needed for mild pain, moderate pain, fever or headache.   Yes [provider]  Amino Acids-Protein Hydrolys (FEEDING SUPPLEMENT, PRO-STAT SUGAR FREE 64,) LIQD Take 30 mLs by mouth 2 (two) times a day.   Yes [provider]  amLODipine (NORVASC) 10 MG tablet Take 10 mg by mouth daily.  Yes [provider]  Brexpiprazole (REXULTI) 0.5 MG TABS Take 1 mg by mouth at bedtime.   Yes [provider]  donepezil (ARICEPT) 5 MG tablet Take 5 mg by mouth at bedtime.   Yes [provider]  meclizine (ANTIVERT) 25 MG tablet Take 25 mg by mouth 2 (two) times daily.   Yes [provider]  metoprolol tartrate (LOPRESSOR) 25 MG tablet Take 1 tablet (25 mg total) by mouth 2 (two) times daily. 06/25/18  Yes Oretha Milch D, MD  OXYGEN Inhale 2 L into the lungs continuous.   Yes [provider]  pregabalin (LYRICA) 50 MG capsule Take 50 mg by mouth at bedtime.   Yes [provider]  valACYclovir (VALTREX) 500 MG tablet Take 500 mg by mouth 2 (two) times daily.   Yes [provider]     Critical care time: 49min      Italia Wolfert E Tyrie Porzio ACNP-BC [place] Pager # [phone] OR # [phone] if no answer

## 2018-09-09 NOTE — Plan of Care (Signed)
  Problem: Activity: Goal: Ability to tolerate increased activity will improve Outcome: Progressing   

## 2018-09-09 NOTE — ED Provider Notes (Signed)
MSE was initiated and I personally evaluated the patient and placed orders (if any) at  6:48 AM on September 09, 2018.  The patient appears stable so that the remainder of the MSE may be completed by another provider.  Patient is on aspiration precautions at her facility     NCAT Neck is supple no stridor RRR Rhonchi in all fields, rales B bases  Nabs soft From x 4   Plan labs and Jetta Lout, Quayshaun Hubbert, MD 09/09/18 [phone]

## 2018-09-09 NOTE — Progress Notes (Signed)
Patient transported to ICU without complications on full support and 100% FiO2.

## 2018-09-09 NOTE — Progress Notes (Signed)
Pharmacy Antibiotic Note  Taylor Ruiz is a 83 y.o. female admitted on 09/09/2018 with pneumonia.  Pharmacy has been consulted for Vancomycin and Zosyn dosing.  Plan:  Vancomycin 1000 mg IV now, then 1000 mg IV q36 hr (est AUC 513 based on SCr 1.45)  Measure vancomycin AUC at steady state as indicated  Zosyn 3.375 g IV given once over 30 minutes, then every 8 hrs by 4-hr infusion  Daily SCr while on vanc & Zosyn combined  Height: 5\' 8"  (172.7 cm) IBW/kg (Calculated) : 63.9  Temp (24hrs), Avg:97.7 F (36.5 C), Min:97.7 F (36.5 C), Max:97.7 F (36.5 C)  Recent Labs  Lab 09/09/18 0704  WBC 17.7*  CREATININE 1.45*    CrCl cannot be calculated (Unknown ideal weight.).    No Known Allergies  Antimicrobials this admission: 7/20 vanc >>  7/20 Zosyn >>   Dose adjustments this admission: n/a  Microbiology results: 7/20 BCx (x1): IP 7/20 COVID: neg  Thank you for allowing pharmacy to be a part of this patient's care.  Reuel Boom, PharmD, BCPS [phone] 09/09/2018, 12:14 PM

## 2018-09-09 NOTE — Consult Note (Signed)
[facility] Nurse wound consult note Reason for Consult: Unstageable PI to bilateral heels, L>R, Stage 3 to coccygeal area Wound type:Pressure Pressure Injury POA: Yes Measurement: Left heel with eschar in area measuring 4cm x 3.4cm. No drainage, no erythema, induration or fluctuance. Right heel with small eschar and surrounding purple discoloration measuring 5cm x 3cm. No drainage, erythema, induration or fluctuance. Sacral/coccygeal Stage 3 PI. Measurements per Nursing Flow sheet. Yellow wound bed, no surrounding erythema. No exudate. Wound bed:As noted above Drainage (amount, consistency, odor) As noted above Periwound:AS noted above Dressing procedure/placement/frequency: Bilateral Pressure redistribution heel boots are provided, damp to dry saline to sacral/coccygeal wound topped with silicone foam topped. Turning and repositioning, external female urinary incontinence collector (PurWick) in place.  [facility] nursing team will not follow, but will remain available to this patient, the nursing and medical teams.  Please re-consult if needed. Thanks, Maudie Flakes, MSN, RN, [place], Arther Abbott  Pager# [phone]

## 2018-09-09 NOTE — Progress Notes (Signed)
A consult was received from an ED physician for Vancomycin & Cefepime per pharmacy dosing.  The patient's profile has been reviewed for ht/wt/allergies/indication/available labs.    A one time order has been placed for Vancomycin 1gm and Cefepime 2gm (by ED provider).  Further antibiotics/pharmacy consults should be ordered by admitting physician if indicated.                       Thank you,  Minda Ditto PharmD 09/09/2018  7:25 AM

## 2018-09-09 NOTE — Progress Notes (Signed)
[facility] Progress Note Patient Name: Taylor Ruiz DOB: 09/09/1935 MRN: [id number]   Date of Service  09/09/2018  HPI/Events of Note  Hypotension - BP = 87/50. Patient had bigeminy and 6 second pause with Norepinephrine IV infusion.  eICU Interventions  Will order: 1. Phenylephrine IV infusion. Titrate to MAP >+ 65.      Intervention Category Major Interventions: Hypotension - evaluation and management  Sommer,Steven Eugene 09/09/2018, 9:40 PM

## 2018-09-10 DIAGNOSIS — G9340 Encephalopathy, unspecified: Secondary | ICD-10-CM

## 2018-09-10 LAB — BASIC METABOLIC PANEL
Anion gap: 11 (ref 5–15)
Anion gap: 9 (ref 5–15)
Anion gap: 9 (ref 5–15)
BUN: 43 mg/dL — ABNORMAL HIGH (ref 8–23)
BUN: 49 mg/dL — ABNORMAL HIGH (ref 8–23)
BUN: 39 mg/dL — ABNORMAL HIGH (ref 8–23)
CO2: 17 mmol/L — ABNORMAL LOW (ref 22–32)
CO2: 18 mmol/L — ABNORMAL LOW (ref 22–32)
CO2: 18 mmol/L — ABNORMAL LOW (ref 22–32)
Calcium: 8.1 mg/dL — ABNORMAL LOW (ref 8.9–10.3)
Calcium: 8.7 mg/dL — ABNORMAL LOW (ref 8.9–10.3)
Calcium: 8 mg/dL — ABNORMAL LOW (ref 8.9–10.3)
Chloride: 125 mmol/L — ABNORMAL HIGH (ref 98–111)
Chloride: 126 mmol/L — ABNORMAL HIGH (ref 98–111)
Chloride: 121 mmol/L — ABNORMAL HIGH (ref 98–111)
Creatinine, Ser: 1.17 mg/dL — ABNORMAL HIGH (ref 0.44–1.00)
Creatinine, Ser: 1.26 mg/dL — ABNORMAL HIGH (ref 0.44–1.00)
Creatinine, Ser: 0.96 mg/dL (ref 0.44–1.00)
GFR calc Af Amer: 51 mL/min — ABNORMAL LOW (ref >60)
GFR calc Af Amer: 46 mL/min — ABNORMAL LOW (ref >60)
GFR calc Af Amer: >60 mL/min (ref >60)
GFR calc non Af Amer: 44 mL/min — ABNORMAL LOW (ref >60)
GFR calc non Af Amer: 40 mL/min — ABNORMAL LOW (ref >60)
GFR calc non Af Amer: 55 mL/min — ABNORMAL LOW (ref >60)
Glucose, Bld: 133 mg/dL — ABNORMAL HIGH (ref 70–99)
Glucose, Bld: 141 mg/dL — ABNORMAL HIGH (ref 70–99)
Glucose, Bld: 100 mg/dL — ABNORMAL HIGH (ref 70–99)
Potassium: 2.9 mmol/L — ABNORMAL LOW (ref 3.5–5.1)
Potassium: 3.5 mmol/L (ref 3.5–5.1)
Potassium: 3.4 mmol/L — ABNORMAL LOW (ref 3.5–5.1)
Sodium: 153 mmol/L — ABNORMAL HIGH (ref 135–145)
Sodium: 153 mmol/L — ABNORMAL HIGH (ref 135–145)
Sodium: 148 mmol/L — ABNORMAL HIGH (ref 135–145)

## 2018-09-10 LAB — GLUCOSE, CAPILLARY
Glucose-Capillary: 101 mg/dL — ABNORMAL HIGH (ref 70–99)
Glucose-Capillary: 101 mg/dL — ABNORMAL HIGH (ref 70–99)
Glucose-Capillary: 109 mg/dL — ABNORMAL HIGH (ref 70–99)
Glucose-Capillary: 125 mg/dL — ABNORMAL HIGH (ref 70–99)
Glucose-Capillary: 132 mg/dL — ABNORMAL HIGH (ref 70–99)
Glucose-Capillary: 134 mg/dL — ABNORMAL HIGH (ref 70–99)
Glucose-Capillary: 137 mg/dL — ABNORMAL HIGH (ref 70–99)

## 2018-09-10 LAB — PROCALCITONIN: Procalcitonin: 2 ng/mL

## 2018-09-10 LAB — PHOSPHORUS: Phosphorus: <1 mg/dL — CL (ref 2.5–4.6)

## 2018-09-10 LAB — LACTIC ACID, PLASMA: Lactic Acid, Venous: 3.3 mmol/L (ref 0.5–1.9)

## 2018-09-10 LAB — MAGNESIUM: Magnesium: 1.9 mg/dL (ref 1.7–2.4)

## 2018-09-10 MED ORDER — VITAL AF 1.2 CAL PO LIQD
1000.0000 mL | ORAL | Status: DC
  Administered 2018-09-10 – 2018-09-12 (×4): 1000 mL
  Filled 2018-09-10 (×5): qty 1000

## 2018-09-10 MED ORDER — POTASSIUM CHLORIDE 10 MEQ/50ML IV SOLN
10.0000 meq | INTRAVENOUS | Status: AC
  Administered 2018-09-10 (×5): 10 meq via INTRAVENOUS
  Filled 2018-09-10 (×4): qty 50

## 2018-09-10 MED ORDER — POTASSIUM & SODIUM PHOSPHATES 280-160-250 MG PO PACK
2.0000 | PACK | Freq: Three times a day (TID) | ORAL | Status: AC
  Administered 2018-09-10 (×3): 2 via ORAL
  Filled 2018-09-10 (×3): qty 2

## 2018-09-10 MED ORDER — POTASSIUM PHOSPHATES 15 MMOLE/5ML IV SOLN
30.0000 mmol | Freq: Once | INTRAVENOUS | Status: AC
  Administered 2018-09-10: 30 mmol via INTRAVENOUS
  Filled 2018-09-10: qty 10

## 2018-09-10 MED ORDER — FREE WATER
200.0000 mL | Status: DC
  Administered 2018-09-10 – 2018-09-16 (×35): 200 mL

## 2018-09-10 MED ORDER — POTASSIUM CHLORIDE 10 MEQ/100ML IV SOLN
INTRAVENOUS | Status: AC
  Filled 2018-09-10: qty 100

## 2018-09-10 NOTE — Progress Notes (Signed)
[facility] Progress Note Patient Name: Texanna Hilburn DOB: [birth year] MRN: [id number]   Date of Service  09/10/2018  HPI/Events of Note  Hypokalemia - K+ = 2.9 and Creatinine = 1.17.  eICU Interventions  Will replace K+.      Intervention Category Major Interventions: Electrolyte abnormality - evaluation and management  Sommer,Steven Eugene 09/10/2018, 2:13 AM

## 2018-09-10 NOTE — Progress Notes (Addendum)
CRITICAL VALUE ALERT  Critical Value:  Phosphorus < 1.0  Date & Time Notied:  09/10/2018 1030    Provider Notified: Marni Griffon, NP  Orders Received/Actions taken: Orders received

## 2018-09-10 NOTE — Progress Notes (Signed)
Pt weaned from 0750 to 1510 (7 hours) on peep of 5, PS 10.

## 2018-09-10 NOTE — TOC Initial Note (Signed)
Transition of Care [facility](TOC) - Initial/Assessment Note    Patient Details  Name: Taylor Ruiz MRN: [id number] Date of Birth: 11/18/1935  Transition of Care [facility](TOC) CM/SW Contact:    Bartholome BillLEMENTS, Aastha Dayley H, RN Phone Number: 09/10/2018, 11:20 AM  Clinical Narrative:                 [facility]. Per liaison pt can come back to facility. Per MD notes GOC conversations are occurring with family. Pt is currently on the vent.  Expected Discharge Plan: Skilled Nursing Facility Barriers to Discharge: Continued Medical Work up   Patient Goals and CMS Choice Patient states their goals for this hospitalization and ongoing recovery are:: Pt on vent.      Expected Discharge Plan and Services Expected Discharge Plan: Skilled Nursing Facility   Discharge Planning Services: CM Consult   Living arrangements for the past 2 months: Skilled Nursing Facility                                      Prior Living Arrangements/Services Living arrangements for the past 2 months: Skilled Nursing Facility   Patient language and need for interpreter reviewed:: Yes        Need for Family Participation in Patient Care: Yes (Comment) Care giver support system in place?: Yes (comment)   Criminal Activity/Legal Involvement Pertinent to Current Situation/Hospitalization: No - Comment as needed  Activities of Daily Living   ADL Screening (condition at time of admission) Patient's cognitive ability adequate to safely complete daily activities?: No Is the patient deaf or have difficulty hearing?: No Does the patient have difficulty seeing, even when wearing glasses/contacts?: No Does the patient have difficulty concentrating, remembering, or making decisions?: No Patient able to express need for assistance with ADLs?: No Does the patient have difficulty dressing or bathing?: Yes Independently performs ADLs?: No Communication: Dependent Is this a change from baseline?: Pre-admission  baseline Dressing (OT): Dependent Is this a change from baseline?: Pre-admission baseline Grooming: Dependent Is this a change from baseline?: Pre-admission baseline Feeding: Dependent Is this a change from baseline?: Pre-admission baseline Bathing: Dependent Is this a change from baseline?: Pre-admission baseline Toileting: Dependent Is this a change from baseline?: Pre-admission baseline In/Out Bed: Dependent Is this a change from baseline?: Pre-admission baseline Walks in Home: Dependent Is this a change from baseline?: Pre-admission baseline Does the patient have difficulty walking or climbing stairs?: Yes Weakness of Legs: Both Weakness of Arms/Hands: Both  Permission Sought/Granted                  Emotional Assessment   Attitude/Demeanor/Rapport: Intubated (Following Commands or Not Following Commands) Affect (typically observed): Unable to Assess        Admission diagnosis:  Hypoxemia [R09.02] Hypernatremia [E87.0] Obtundation [R40.1] SOB (shortness of breath) [R06.02] Patient Active Problem List   Diagnosis Date Noted  . Acute respiratory failure (HCC) 09/09/2018  . Hypernatremia   . Obtundation   . Pneumonia 07/24/2018  . Pressure injury of skin 07/24/2018  . HCAP (healthcare-associated pneumonia) 07/24/2018  . AKI (acute kidney injury) (HCC) 07/24/2018  . FTT (failure to thrive) in adult 07/24/2018  . HTN (hypertension) 07/24/2018  . Acute metabolic encephalopathy 07/24/2018  . Syncope 06/24/2018  . Dementia (HCC) 06/24/2018  . Mild renal insufficiency 06/24/2018  . Hypokalemia 06/24/2018  . Elevated troponin 06/24/2018   PCP:  Ralene OkMoreira, Roy, MD Pharmacy:  No Pharmacies  Listed    Social Determinants of Health (SDOH) Interventions    Readmission Risk Interventions Readmission Risk Prevention Plan 09/10/2018  Transportation Screening Complete  PCP or Specialist Appt within 3-5 Days Not Complete  Not Complete comments Not close to discharge   [place] or [place] Not Complete  [place] or Home Care Consult comments From SNF  Social Work Consult for [place] Planning/Counseling Not Complete  SW consult not completed comments NA  Palliative Care Screening Complete  Medication Review Press photographer) Complete  Marney Doctor [phone]

## 2018-09-10 NOTE — Progress Notes (Signed)
   NAME:  Taylor Ruiz, MRN:  [id number], DOB:  02-14-1936, LOS: 1 ADMISSION DATE:  09/09/2018, CONSULTATION DATE:  09/09/2018 REFERRING MD:  Dr. Nanda Quinton, CHIEF COMPLAINT:  Aspiration  Brief History   83 year old female with past medical history significant for dementia and hx of aspiration presented for hypoxemia, increased work of breathing, and altered mental status.     Past Medical History  Dementia, Hypertension, Vertigo, Hx of failure to thrive, Mild renal insufficiency, Stroke   Significant Hospital Events   7/20 Admit, intubated, volume resuscitated. ABX started  Consults:    Procedures:  ETT 7/20 >> RUE PICC 7/20>>>  Significant Diagnostic Tests:    Micro Data:  7/20 SARS-CoV-2: Negative 7/20 Blood Cx >> 7/20 Urine Cx >> 7/20 resp culture: few gpc, few GNR Antimicrobials:  Zosyn 7/20>>> vanc 7/20>>>  Interim history/subjective:  Weaning pressors   Objective   Blood pressure (Abnormal) 147/59, pulse 93, temperature 98.8 F (37.1 C), resp. rate (Abnormal) 26, height 5\' 8"  (1.727 m), weight 62.2 kg, SpO2 100 %.    Vent Mode: PSV;CPAP FiO2 (%):  [30 %-50 %] 30 % Set Rate:  [14 bmp] 14 bmp Vt Set:  [510 mL] 510 mL PEEP:  [5 cmH20] 5 cmH20 Pressure Support:  [10 cmH20] 10 cmH20 Plateau Pressure:  [16 cmH20-18 cmH20] 18 cmH20   Intake/Output Summary (Last 24 hours) at 09/10/2018 0909 Last data filed at 09/10/2018 4315 Gross per 24 hour  Intake 2669.42 ml  Output 250 ml  Net 2419.42 ml   Filed Weights   09/09/18 1315 09/10/18 0453  Weight: 60.4 kg 62.2 kg    Examination:  General frail 83 year old black female. She is sedated. Appears chronically ill HENT temporal wasting orally intubated no JVD MMM pulm right > left rales/rhonchi no accessory use. Equal chest rise on vent  Card RRR w/out MRG abd not tender + bowel sounds  gu cl yellow Neuro left sided hemiparesis. Not following commands.   Resolved Hospital Problem list   Lactic acidosis    Assessment & Plan:   Acute Hypoxemia Respiratory Failure/ Aspiration pneumonitis vs pneumonia  7/20 CXR R mid and lower lobe infiltrates Mental status will not support extubation as of yet Plan:  Cont PSV as tolerated and daily assessment of SBT PAD protocol RASS goal 0 Day # 2 abx (vanc and zosyn) Await pending cultures; likely stop vanc soon   Acute metabolic encephalopathy on dementia  Plan: Holding home meds Supportive care PAD protocol  SIRS/Sepsis w/ septic shock w/ element of volume depletion Pressor requirements improving Plan: Holding home antihypertensives Cont IVFs (see below) Wean neo for SBP > 100 abx as above Trend cbc  Acute Kidney Injury->improved Plan: Avoid nephrotoxins Avoid hypotension Am chemistry   Fluid and Electrolyte Imbalance: Hypernatremia, non-anion gap metabolic acidosis 2/2 hyperchloremia, hypokalemia Slowly improving. Na 156->153 Plan:  Replace K Will add 260ml free water via tube q 4 Cont LR at 50 Am chem  Best practice:  Diet: NPO Pain/Anxiety/Delirium protocol (if indicated): 7/20 initiated VAP protocol (if indicated): 7/20 initiated  DVT prophylaxis: SQ heparin GI prophylaxis: H2B Glucose control: na Mobility: Bed rest Code Status: FULL Family Communication: Unable to contact Disposition: ICU    Erick Colace ACNP-BC [place] Pager # [phone] OR # [phone] if no answer

## 2018-09-10 NOTE — Progress Notes (Signed)
Initial Nutrition Assessment  RD working remotely.   DOCUMENTATION CODES:   (unable to assess for malnutrition at this time.)  INTERVENTION:  - will order Vital AF 1.2 @ 25 ml/hr to advance by 10 ml every 12 hours to reach goal rate of 55 ml/hr. - at goal rate, this regimen will provide 1584 kcal, 99 grams protein, and 1070 ml free water. - free water flush to be per MD/NP given severe hypernatremia and hyperchloridemia.  - will order juven to promote wound healing once sepsis has improved and septic shock is no longer a risk.    Monitor magnesium, potassium, and phosphorus daily for at least 3 days, MD to replete as needed, as pt is at risk for refeeding syndrome given weight loss in the past 1.5 months and current hypokalemia.   NUTRITION DIAGNOSIS:   Inadequate oral intake related to inability to eat as evidenced by NPO status.  GOAL:   Patient will meet greater than or equal to 90% of their needs  MONITOR:   Vent status, TF tolerance, Labs, Weight trends, Skin  REASON FOR ASSESSMENT:   Ventilator, Consult Enteral/tube feeding initiation and management  ASSESSMENT:   83 year old woman with medical history of HTN, dementia, history of CVA with associated dysphasia, and chronic renal insufficiency. She resides in SNF, was found to have acute onset of encephalopathy and hypoxemic respiratory failure. Of note recent hospitalization 6/2-6/7 for HCAP and metabolic encephalopathy. She required intubation mechanical ventilation in the ED on 7/20. CXR showed stable right mid and lower lobe infiltrates. Labs significant for severe hypernatremia, evolving acute renal insufficiency, leukocytosis. Her COVID test was negative.  Patient remains intubated with OGT in place. TF started per protocol yesterday: Vital High Protein @ 40 ml/hr with 30 ml prostat BID. This regimen provides 1160 kcal, 114 grams protein, and 802 ml free water. Will adjust TF regimen as outlined above.  Patient was  seen remotely by this RD on 6/3 at which time patient was a/o to self only and no nutrition-related information was able to be obtained. In notes from that admission it was documented that family had reported poor oral intakes for a prolonged period PTA.  Per chart review, current weight is 137 lb and weight on 6/3 was 148 lb. This indicates 11 lb weight loss (7.4% body weight) in the past 1.5 months which is significant for time frame, but may be at least partly d/t dehydration with hypernatremia.   Per notes: - plan for follow-up GOC discussion this AM (7/21) - aspiration pneumonitis vs PNA - acute metabolic encephalopathy on top of hx of dementia - SIRS/sepsis with septic shock and volume depletion - metabolic acidosis - AKI   Patient is currently intubated on ventilator support MV: 9.8 L/min Temp (24hrs), Avg:99.7 F (37.6 C), Min:98 F (36.7 C), Max:100.4 F (38 C) Propofol: none  Labs reviewed; CBGs: 137, 132, and 125 mg/dl today, Na: 952153 mmol/l, Cl: 125 mmol/l, K: 2.9 mmol/l, BUN: 43 mg/dl, creatinine: 8.411.17 mg/dl, Ca: 8.1 mg/dl, GFR: 44 ml/min.  Medications reviewed; 20 mg IV pepcid BID.  Drip; neo @ 20 mcg/min IVF; LR @ 75 ml/hr.     NUTRITION - FOCUSED PHYSICAL EXAM:  unable to complete at this time.   Diet Order:   Diet Order            Diet NPO time specified  Diet effective now              EDUCATION NEEDS:   No education  needs have been identified at this time  Skin:  Skin Assessment: Skin Integrity Issues: Skin Integrity Issues:: Stage III, Unstageable Stage III: sacrum Unstageable: full thickness to both ankles  Last BM:  PTA/unknown  Height:   Ht Readings from Last 1 Encounters:  09/09/18 5\' 8"  (1.727 m)    Weight:   Wt Readings from Last 1 Encounters:  09/10/18 62.2 kg    Ideal Body Weight:  63.6 kg  BMI:  Body mass index is 20.85 kg/m.  Estimated Nutritional Needs:   Kcal:  1533 kcal  Protein:  75-93 grams  Fluid:  >/= 2.5  L/day      Jarome Matin, MS, RD, LDN, [facility] Inpatient Clinical Dietitian Pager # [phone] After hours/weekend pager # [phone]

## 2018-09-10 NOTE — Progress Notes (Signed)
eLink Physician-Brief Progress Note Patient Name: Taylor Ruiz DOB: November 24, 1935 MRN: [id number]   Date of Service  09/10/2018  HPI/Events of Note  83 year old female admitted to the ICU with respiratory failure requiring intubation and now developed massive diarrhea.  eICU Interventions  Ordered Flexi-Seal and C. difficile testing.     Intervention Category Intermediate Interventions: Communication with other healthcare providers and/or family  Mady Gemma 09/10/2018, 9:10 PM

## 2018-09-11 ENCOUNTER — Inpatient Hospital Stay (HOSPITAL_COMMUNITY): Payer: Medicare Other

## 2018-09-11 DIAGNOSIS — N179 Acute kidney failure, unspecified: Secondary | ICD-10-CM

## 2018-09-11 LAB — CBC
HCT: 29.8 % — ABNORMAL LOW (ref 36.0–46.0)
Hemoglobin: 8.9 g/dL — ABNORMAL LOW (ref 12.0–15.0)
MCH: 29.9 pg (ref 26.0–34.0)
MCHC: 29.9 g/dL — ABNORMAL LOW (ref 30.0–36.0)
MCV: 100 fL (ref 80.0–100.0)
Platelets: UNDETERMINED 10*3/uL (ref 150–400)
RBC: 2.98 MIL/uL — ABNORMAL LOW (ref 3.87–5.11)
RDW: 18.8 % — ABNORMAL HIGH (ref 11.5–15.5)
WBC: 16.9 10*3/uL — ABNORMAL HIGH (ref 4.0–10.5)
nRBC: 0.3 % — ABNORMAL HIGH (ref 0.0–0.2)

## 2018-09-11 LAB — GLUCOSE, CAPILLARY
Glucose-Capillary: 97 mg/dL (ref 70–99)
Glucose-Capillary: 93 mg/dL (ref 70–99)
Glucose-Capillary: 94 mg/dL (ref 70–99)
Glucose-Capillary: 87 mg/dL (ref 70–99)

## 2018-09-11 LAB — BASIC METABOLIC PANEL
Anion gap: 8 (ref 5–15)
BUN: 31 mg/dL — ABNORMAL HIGH (ref 8–23)
CO2: 17 mmol/L — ABNORMAL LOW (ref 22–32)
Calcium: 8 mg/dL — ABNORMAL LOW (ref 8.9–10.3)
Chloride: 122 mmol/L — ABNORMAL HIGH (ref 98–111)
Creatinine, Ser: 0.81 mg/dL (ref 0.44–1.00)
GFR calc Af Amer: >60 mL/min (ref >60)
GFR calc non Af Amer: >60 mL/min (ref >60)
Glucose, Bld: 111 mg/dL — ABNORMAL HIGH (ref 70–99)
Potassium: 3 mmol/L — ABNORMAL LOW (ref 3.5–5.1)
Sodium: 147 mmol/L — ABNORMAL HIGH (ref 135–145)

## 2018-09-11 LAB — CREATININE, SERUM
Creatinine, Ser: 0.76 mg/dL (ref 0.44–1.00)
GFR calc Af Amer: >60 mL/min (ref >60)
GFR calc non Af Amer: >60 mL/min (ref >60)

## 2018-09-11 LAB — PROCALCITONIN: Procalcitonin: 1.25 ng/mL

## 2018-09-11 LAB — C DIFFICILE QUICK SCREEN W PCR REFLEX
C Diff antigen: NEGATIVE
C Diff interpretation: NOT DETECTED
C Diff toxin: NEGATIVE

## 2018-09-11 LAB — PHOSPHORUS: Phosphorus: 1.9 mg/dL — ABNORMAL LOW (ref 2.5–4.6)

## 2018-09-11 LAB — MAGNESIUM: Magnesium: 1.5 mg/dL — ABNORMAL LOW (ref 1.7–2.4)

## 2018-09-11 MED ORDER — ORAL CARE MOUTH RINSE
15.0000 mL | Freq: Two times a day (BID) | OROMUCOSAL | Status: DC
  Administered 2018-09-11 – 2018-09-16 (×11): 15 mL via OROMUCOSAL

## 2018-09-11 MED ORDER — POTASSIUM PHOSPHATES 15 MMOLE/5ML IV SOLN
30.0000 mmol | Freq: Once | INTRAVENOUS | Status: AC
  Administered 2018-09-11: 30 mmol via INTRAVENOUS
  Filled 2018-09-11: qty 10

## 2018-09-11 MED ORDER — MAGNESIUM SULFATE 2 GM/50ML IV SOLN
2.0000 g | Freq: Once | INTRAVENOUS | Status: AC
  Administered 2018-09-11: 2 g via INTRAVENOUS
  Filled 2018-09-11: qty 50

## 2018-09-11 NOTE — Progress Notes (Signed)
   NAME:  Taylor Ruiz, MRN:  [id number], DOB:  May 22, 1935, LOS: 2 ADMISSION DATE:  09/09/2018, CONSULTATION DATE:  09/09/2018 REFERRING MD:  Dr. Nanda Quinton, CHIEF COMPLAINT:  Aspiration  Brief History   83 year old female with past medical history significant for dementia and hx of aspiration presented for hypoxemia, increased work of breathing, and altered mental status.    Past Medical History  Dementia, Hypertension, Vertigo, Hx of failure to thrive, Mild renal insufficiency, Stroke   Significant Hospital Events   7/20 Admit, intubated, volume resuscitated. ABX started 7/21 Off pressors, diarrhea, tested for C difficile 7/22 Extubated, vanc stopped  Consults:    Procedures:  ETT 7/20 >> 7/22 RUE PICC 7/20>>>  Significant Diagnostic Tests:     Micro Data:  7/20 SARS-CoV-2: Negative 7/20 Blood Cx >> 7/20 Urine Cx >> 7/20 resp culture: few gpc, few GNR  Antimicrobials:  Zosyn 7/20>>> vanc 7/20>>>7/22  Interim history/subjective:   Overnight, developed diarrhea and tested for C difficile, Extubate  Objective   Blood pressure (Abnormal) 137/52, pulse 98, temperature 99.1 F (37.3 C), resp. rate (Abnormal) 31, height 5\' 8"  (1.727 m), weight 67.5 kg, SpO2 100 %.    Vent Mode: PSV;CPAP FiO2 (%):  [30 %] 30 % Set Rate:  [14 bmp] 14 bmp Vt Set:  [510 mL] 510 mL PEEP:  [5 cmH20] 5 cmH20 Pressure Support:  [5 cmH20-10 cmH20] 5 cmH20 Plateau Pressure:  [13 cmH20-18 cmH20] 13 cmH20   Intake/Output Summary (Last 24 hours) at 09/11/2018 0946 Last data filed at 09/11/2018 3154 Gross per 24 hour  Intake 4504.08 ml  Output 751 ml  Net 3753.08 ml   Filed Weights   09/09/18 1315 09/10/18 0453 09/11/18 0359  Weight: 60.4 kg 62.2 kg 67.5 kg    Examination: General: 83 year old elderly, ill-appearing female, grimaces with any movement HEENT: [place]/AT, no JVD, MMM, ETT and OGT in place CV: S1/S2, no murmurs, rubs or gallops Resp: Bilateral rhonchi in the bases GI: Soft,  nontender, rectal tube in place, brown-colored stool GU: Yellow-colored urine, adequate urine output Ext: Warm, dry, palpable pulses, +2 generalized pitting edema   Resolved Hospital Problem list   Lactic acidosis   Assessment & Plan:   Acute Hypoxemia Respiratory Failure/ Aspiration pneumonitis vs pneumonia  Plan:  Extubate w/ no re-intubation Day #3 abx (Vanc, Zosyn); dc vanc Wean oxygen Aspiration precautions   Acute metabolic encephalopathy on dementia  Plan: Holding home meds Supportive care PAD protocol  SIRS/Sepsis w/ septic shock w/ element of volume depletion Off Pressors  Plan: Hold home antihypertensives Continue IVFs Continue antibiotics Trend CBCs  Acute Kidney Injury->improved Plan:  Avoid nephrotoxic medications Maintain adequate perfusion Trend Cr and urine output  Fluid and Electrolyte Imbalance: Hypernatremia, non-anion gap metabolic acidosis 2/2 hyperchloremia, hypokalemia  Plan:  Continue maintance IVF; awaiting am chem Replace, as indicated  Best practice:  Diet: NPO Pain/Anxiety/Delirium protocol (if indicated): 7/20 initated VAP protocol (if indicated): 7/20 Initated DVT prophylaxis: SQ heparin GI prophylaxis: famotidine Glucose control: CGB q4h Mobility: Bed rest Code Status: DNR Family Communication: 7/21  Disposition: ICU; extubate. Full DNR now   Critical Care time: 98 min  Erick Colace ACNP-BC [place] Pager # [phone] OR # [phone] if no answer

## 2018-09-11 NOTE — Progress Notes (Signed)
PHARMACY NOTE -  [place] has been assisting with dosing of Zosyn for suspected aspiration PNA.  Dosage remains stable at 3.375 g IV q8 hr and need for further dosage adjustment appears unlikely at present given SCr improved to baseline  Pharmacy will sign off, following peripherally for culture results or dose adjustments. Please reconsult if a change in clinical status warrants re-evaluation of dosage.  Reuel Boom, PharmD, BCPS [phone] 09/11/2018, 12:46 PM

## 2018-09-11 NOTE — Procedures (Signed)
Extubation Procedure Note  Patient Details:   Name: Taylor Ruiz DOB: 03/05/1935 MRN: [id number]   Airway Documentation:  Airway 7.5 mm (Active)  Secured at (cm) 22 cm 09/11/18 0814  Measured From Lips 09/11/18 [place] 09/11/18 0814  Secured By [employer] 09/11/18 0814  Tube Holder Repositioned Yes 09/11/18 0814  Cuff Pressure (cm H2O) 25 cm H2O 09/10/18 0745  Site Condition Dry 09/11/18 0814   Vent end date: 09/11/18 Vent end time: 1100   Evaluation  O2 sats: stable throughout Complications: No apparent complications  Patient did tolerate procedure well. Bilateral Breath Sounds: Rhonchi, Diminished     No stridor noted and patient placed to 2 L [place] post extubation.   Lamonte Sakai 09/11/2018, 11:05 AM

## 2018-09-12 ENCOUNTER — Inpatient Hospital Stay (HOSPITAL_COMMUNITY): Payer: Medicare Other

## 2018-09-12 DIAGNOSIS — J690 Pneumonitis due to inhalation of food and vomit: Secondary | ICD-10-CM

## 2018-09-12 DIAGNOSIS — J9600 Acute respiratory failure, unspecified whether with hypoxia or hypercapnia: Secondary | ICD-10-CM

## 2018-09-12 DIAGNOSIS — R627 Adult failure to thrive: Secondary | ICD-10-CM

## 2018-09-12 DIAGNOSIS — Z515 Encounter for palliative care: Secondary | ICD-10-CM

## 2018-09-12 DIAGNOSIS — Z7189 Other specified counseling: Secondary | ICD-10-CM

## 2018-09-12 LAB — BASIC METABOLIC PANEL
Anion gap: 8 (ref 5–15)
BUN: 18 mg/dL (ref 8–23)
CO2: 18 mmol/L — ABNORMAL LOW (ref 22–32)
Calcium: 7.7 mg/dL — ABNORMAL LOW (ref 8.9–10.3)
Chloride: 115 mmol/L — ABNORMAL HIGH (ref 98–111)
Creatinine, Ser: 0.64 mg/dL (ref 0.44–1.00)
GFR calc Af Amer: >60 mL/min (ref >60)
GFR calc non Af Amer: >60 mL/min (ref >60)
Glucose, Bld: 98 mg/dL (ref 70–99)
Potassium: 2.9 mmol/L — ABNORMAL LOW (ref 3.5–5.1)
Sodium: 141 mmol/L (ref 135–145)

## 2018-09-12 LAB — GLUCOSE, CAPILLARY
Glucose-Capillary: 102 mg/dL — ABNORMAL HIGH (ref 70–99)
Glucose-Capillary: 102 mg/dL — ABNORMAL HIGH (ref 70–99)
Glucose-Capillary: 110 mg/dL — ABNORMAL HIGH (ref 70–99)
Glucose-Capillary: 79 mg/dL (ref 70–99)
Glucose-Capillary: 80 mg/dL (ref 70–99)
Glucose-Capillary: 86 mg/dL (ref 70–99)
Glucose-Capillary: 88 mg/dL (ref 70–99)
Glucose-Capillary: 99 mg/dL (ref 70–99)

## 2018-09-12 LAB — CBC
HCT: 27.3 % — ABNORMAL LOW (ref 36.0–46.0)
Hemoglobin: 8.3 g/dL — ABNORMAL LOW (ref 12.0–15.0)
MCH: 30.1 pg (ref 26.0–34.0)
MCHC: 30.4 g/dL (ref 30.0–36.0)
MCV: 98.9 fL (ref 80.0–100.0)
Platelets: 98 10*3/uL — ABNORMAL LOW (ref 150–400)
RBC: 2.76 MIL/uL — ABNORMAL LOW (ref 3.87–5.11)
RDW: 18.6 % — ABNORMAL HIGH (ref 11.5–15.5)
WBC: 12.9 10*3/uL — ABNORMAL HIGH (ref 4.0–10.5)
nRBC: 0.4 % — ABNORMAL HIGH (ref 0.0–0.2)

## 2018-09-12 LAB — CULTURE, RESPIRATORY W GRAM STAIN: Culture: NORMAL

## 2018-09-12 LAB — PHOSPHORUS: Phosphorus: 1.8 mg/dL — ABNORMAL LOW (ref 2.5–4.6)

## 2018-09-12 LAB — MAGNESIUM: Magnesium: 1.7 mg/dL (ref 1.7–2.4)

## 2018-09-12 MED ORDER — POTASSIUM PHOSPHATES 15 MMOLE/5ML IV SOLN
30.0000 mmol | Freq: Once | INTRAVENOUS | Status: AC
  Administered 2018-09-12: 30 mmol via INTRAVENOUS
  Filled 2018-09-12: qty 10

## 2018-09-12 MED ORDER — POTASSIUM CHLORIDE 10 MEQ/100ML IV SOLN
10.0000 meq | INTRAVENOUS | Status: AC
  Administered 2018-09-12 (×2): 10 meq via INTRAVENOUS
  Filled 2018-09-12 (×4): qty 100

## 2018-09-12 NOTE — Progress Notes (Signed)
PROGRESS NOTE    Taylor Ruiz  WUJ:[id number] DOB: [birth year] DOA: 09/09/2018 PCP: Ralene Ok, MD   Brief Narrative: 83 year old female with history of dementia, history of aspiration pneumonia presents with hypoxia, increased work of breathing and altered mental status on 7/20, admitted intubated, volume resuscitated and antibiotics were started.  Initially on pressors- stopped 7/21, had diarrhea tested for C. difficile, 7/22 extubated, vancomycin was stopped changed her to DNR/DNI and transferred to hospital service 7/23  Subjective: Eyes closed, appears awake, not responding to any commands, on RA, not in distress, tachypneic in 30s No acute events overnight.  Assessment & Plan:  Acute hypoxic respiratory failure with aspiration pneumonia: Currently on room air, on Zosyn.  Continue aspiration precaution, supportive care.  NG tube in place with tube feed, consult speech therapy.  Acute metabolic encephalopathy in the setting of dementia: Patient appears alert awake but not interactive.  Holding p.o. meds has NG tube in place.  Consulted palliative care.  Septic shock due to #1 on also with volume depletion needing pressors.  Currently hemodynamically stable, off pressors.  Continue antibiotics-Zosyn, throat culture:Gram stain.  Gram-positive cocci in pairs, few gram-negative rods consistent with normal respiratory flora.  Blood cultures 7/20 no growth so far.  AKI due to sepsis volume depletion: Resolved. Recent Labs  Lab 09/10/18 0000 09/10/18 0822 09/10/18 1200 09/11/18 0420 09/12/18 0427  BUN 43* 49* 39* 31* 18  CREATININE 1.17* 1.26* 0.96 0.81   0.76 0.64   Hypokalemia/hypophosphatemia: Repleted IV this morning.  Repeat labs in a.m.   DVT prophylaxis: Heparin Code Status: dnr  Family Communication: palliative care consulted-family has been updated by palliative care and if patient further declines they agree to discontinue NG tube and focus on comfort. I tried niece  Doristine Counter no- no answer. Disposition Plan: Remains inpatient pending clinical improvement.  Consultants:   PCCM Palliative care  Procedures:  ETT-off 7/22  Microbiology: COVID 19 neg  Throat culture 7/20:Gram stain.  Gram-positive cocci in pairs, few gram-negative rods consistent with normal respiratory flora.  Blood cultures 7/20 no growth so far.  Antimicrobials: Anti-infectives (From admission, onward)   Start     Dose/Rate Route Frequency Ordered Stop   09/10/18 1000  vancomycin (VANCOCIN) IVPB 1000 mg/200 mL premix  Status:  Discontinued     1,000 mg 200 mL/hr over 60 Minutes Intravenous Every 36 hours 09/09/18 1433 09/11/18 1044   09/09/18 2200  piperacillin-tazobactam (ZOSYN) IVPB 3.375 g     3.375 g 12.5 mL/hr over 240 Minutes Intravenous Every 8 hours 09/09/18 1213     09/09/18 1600  piperacillin-tazobactam (ZOSYN) IVPB 3.375 g     3.375 g 100 mL/hr over 30 Minutes Intravenous  Once 09/09/18 1213 09/09/18 1650   09/09/18 0730  vancomycin (VANCOCIN) IVPB 1000 mg/200 mL premix     1,000 mg 200 mL/hr over 60 Minutes Intravenous  Once 09/09/18 0719 09/09/18 1230   09/09/18 0730  ceFEPIme (MAXIPIME) 2 g in sodium chloride 0.9 % 100 mL IVPB     2 g 200 mL/hr over 30 Minutes Intravenous  Once 09/09/18 0719 09/09/18 0850   09/09/18 0730  metroNIDAZOLE (FLAGYL) IVPB 500 mg     500 mg 100 mL/hr over 60 Minutes Intravenous  Once 09/09/18 0719 09/09/18 1006       Objective: Vitals:   09/12/18 0000 09/12/18 0400 09/12/18 0500 09/12/18 0700  BP: (!) 119/40 (!) 120/45 (!) 113/49 (!) 109/43  Pulse: 77 95 91 94  Resp: 16 (!) 30 (!)  29 (!) 22  Temp: 98.6 F (37 C) 99.5 F (37.5 C) 99.7 F (37.6 C) 99.9 F (37.7 C)  TempSrc:      SpO2: 98% 97% 98% 98%  Weight:   67 kg   Height:        Intake/Output Summary (Last 24 hours) at 09/12/2018 0845 Last data filed at 09/12/2018 0600 Gross per 24 hour  Intake 2401.28 ml  Output 2150 ml  Net 251.28 ml   Filed Weights    09/10/18 0453 09/11/18 0359 09/12/18 0500  Weight: 62.2 kg 67.5 kg 67 kg   Weight change: -0.5 kg  Body mass index is 22.46 kg/m.  Intake/Output from previous day: 07/22 0701 - 07/23 0700 In: 2401.3 [I.V.:1585.2; IV Piggyback:816] Out: 2150 [Urine:1650; Stool:500] Intake/Output this shift: No intake/output data recorded.  Examination:  General exam: Appears awake, non verbal, not following commands, tachypneic, NGT+ HEENT:PERRL,Oral mucosa moist, Ear/Nose normal on gross exam Respiratory system: Bilateral equal air entry, normal vesicular breath sounds, no wheezes or crackles  Cardiovascular system: S1 & S2 heard,No JVD, murmurs. Gastrointestinal system: Abdomen is  soft, non tender, non distended, BS +  Nervous System:Appears awake, non verbal, not following commands Extremities: leg edema +, no clubbing, distal peripheral pulses palpable. Skin: No rashes, lesions, no icterus MSK: Normal muscle bulk,tone ,power  Medications:  Scheduled Meds:  Chlorhexidine Gluconate Cloth  6 each Topical Daily   free water  200 mL Per Tube Q4H   heparin  5,000 Units Subcutaneous Q8H   mouth rinse  15 mL Mouth Rinse BID   mupirocin ointment  1 application Nasal BID   sodium chloride flush  10-40 mL Intracatheter Q12H   Continuous Infusions:  famotidine (PEPCID) IV Stopped (09/11/18 2253)   feeding supplement (VITAL AF 1.2 CAL) 1,000 mL (09/11/18 2021)   lactated ringers 50 mL/hr at 09/12/18 0600   piperacillin-tazobactam (ZOSYN)  IV 12.5 mL/hr at 09/12/18 0600   potassium chloride 10 mEq (09/12/18 0811)   potassium PHOSPHATE IVPB (in mmol) 30 mmol (09/12/18 0720)    Data Reviewed: I have personally reviewed following labs and imaging studies  CBC: Recent Labs  Lab 09/09/18 0704 09/11/18 1153 09/12/18 0427  WBC 17.7* 16.9* 12.9*  NEUTROABS 14.7*  --   --   HGB 14.7 8.9* 8.3*  HCT 50.2* 29.8* 27.3*  MCV 102.9* 100.0 98.9  PLT 208 PLATELET CLUMPS NOTED ON SMEAR,  UNABLE TO ESTIMATE 98*   Basic Metabolic Panel: Recent Labs  Lab 09/09/18 1403 09/09/18 1724  09/10/18 0000 09/10/18 0822 09/10/18 1200 09/11/18 0420 09/12/18 0427  NA 157* 158*   < > 153* 153* 148* 147* 141  K 4.9 3.8   < > 2.9* 3.5 3.4* 3.0* 2.9*  CL 126* 127*   < > 125* 126* 121* 122* 115*  CO2 14* 19*   < > 17* 18* 18* 17* 18*  GLUCOSE 130* 144*   < > 133* 141* 100* 111* 98  BUN 46* 48*   < > 43* 49* 39* 31* 18  CREATININE 1.43* 1.53*   < > 1.17* 1.26* 0.96 0.81   0.76 0.64  CALCIUM 9.5 9.4   < > 8.1* 8.7* 8.0* 8.0* 7.7*  MG 2.4 2.2  --   --  1.9  --  1.5* 1.7  PHOS 3.5 2.5  --   --  <1.0*  --  1.9* 1.8*   < > = values in this interval not displayed.   GFR: Estimated Creatinine Clearance: 55.6 mL/min (by  C-G formula based on SCr of 0.64 mg/dL). Liver Function Tests: Recent Labs  Lab 09/09/18 0704  AST 17  ALT 10  ALKPHOS 52  BILITOT 0.6  PROT 8.7*  ALBUMIN 3.6   No results for input(s): LIPASE, AMYLASE in the last 168 hours. No results for input(s): AMMONIA in the last 168 hours. Coagulation Profile: No results for input(s): INR, PROTIME in the last 168 hours. Cardiac Enzymes: No results for input(s): CKTOTAL, CKMB, CKMBINDEX, TROPONINI in the last 168 hours. BNP (last 3 results) No results for input(s): PROBNP in the last 8760 hours. HbA1C: No results for input(s): HGBA1C in the last 72 hours. CBG: Recent Labs  Lab 09/11/18 1544 09/11/18 1931 09/11/18 2332 09/12/18 0357 09/12/18 0731  GLUCAP 87 80 79 86 88   Lipid Profile: No results for input(s): CHOL, HDL, LDLCALC, TRIG, CHOLHDL, LDLDIRECT in the last 72 hours. Thyroid Function Tests: No results for input(s): TSH, T4TOTAL, FREET4, T3FREE, THYROIDAB in the last 72 hours. Anemia Panel: No results for input(s): VITAMINB12, FOLATE, FERRITIN, TIBC, IRON, RETICCTPCT in the last 72 hours. Sepsis Labs: Recent Labs  Lab 09/09/18 1403 09/09/18 1724 09/10/18 0822 09/11/18 0420  PROCALCITON 1.61  --   2.00 1.25  LATICACIDVEN  --  4.4* 3.3*  --     Recent Results (from the past 240 hour(s))  SARS Coronavirus 2 (CEPHEID - Performed in [place] hospital lab), Hosp Order     Status: None   Collection Time: 09/09/18  6:48 AM   Specimen: Nasopharyngeal Swab  Result Value Ref Range Status   SARS Coronavirus 2 NEGATIVE NEGATIVE Final    Comment: (NOTE) If result is NEGATIVE SARS-CoV-2 target nucleic acids are NOT DETECTED. The SARS-CoV-2 RNA is generally detectable in upper and lower  respiratory specimens during the acute phase of infection. The lowest  concentration of SARS-CoV-2 viral copies this assay can detect is 250  copies / mL. A negative result does not preclude SARS-CoV-2 infection  and should not be used as the sole basis for treatment or other  patient management decisions.  A negative result may occur with  improper specimen collection / handling, submission of specimen other  than nasopharyngeal swab, presence of viral mutation(s) within the  areas targeted by this assay, and inadequate number of viral copies  (<250 copies / mL). A negative result must be combined with clinical  observations, patient history, and epidemiological information. If result is POSITIVE SARS-CoV-2 target nucleic acids are DETECTED. The SARS-CoV-2 RNA is generally detectable in upper and lower  respiratory specimens dur ing the acute phase of infection.  Positive  results are indicative of active infection with SARS-CoV-2.  Clinical  correlation with patient history and other diagnostic information is  necessary to determine patient infection status.  Positive results do  not rule out bacterial infection or co-infection with other viruses. If result is PRESUMPTIVE POSTIVE SARS-CoV-2 nucleic acids MAY BE PRESENT.   A presumptive positive result was obtained on the submitted specimen  and confirmed on repeat testing.  While 2019 novel coronavirus  (SARS-CoV-2) nucleic acids may be present in  the submitted sample  additional confirmatory testing may be necessary for epidemiological  and / or clinical management purposes  to differentiate between  SARS-CoV-2 and other Sarbecovirus currently known to infect humans.  If clinically indicated additional testing with an alternate test  methodology [phone]) is advised. The SARS-CoV-2 RNA is generally  detectable in upper and lower respiratory sp ecimens during the acute  phase of  infection. The expected result is Negative. Fact Sheet for Patients:  BoilerBrush.com.cyhttps://www.fda.gov/media/136312/download Fact Sheet for Healthcare Providers: https://pope.com/https://www.fda.gov/media/136313/download This test is not yet approved or cleared by the [place] FDA and has been authorized for detection and/or diagnosis of SARS-CoV-2 by FDA under an Emergency Use Authorization (EUA).  This EUA will remain in effect (meaning this test can be used) for the duration of the COVID-19 declaration under Section 564(b)(1) of the Act, 21 U.S.C. section 360bbb-3(b)(1), unless the authorization is terminated or revoked sooner. Performed at [facility], [address] [place], [place] [address]   Blood culture (routine x 2)     Status: None (Preliminary result)   Collection Time: 09/09/18  6:53 AM   Specimen: BLOOD  Result Value Ref Range Status   Specimen Description   Final    BLOOD RIGHT ANTECUBITAL Performed at [facility], [address] [place], [place] [address]    Special Requests   Final    BOTTLES DRAWN AEROBIC AND ANAEROBIC Blood Culture adequate volume Performed at [facility], [address] [place], [place] [address]    Culture   Final    NO GROWTH 2 DAYS Performed at [facility], [address] [place], [place] [address]    Report Status PENDING  Incomplete  MRSA PCR Screening     Status: Abnormal   Collection Time: 09/09/18  2:02 PM   Specimen: Nasal Mucosa; Nasopharyngeal  Result Value Ref  Range Status   MRSA by PCR POSITIVE (A) NEGATIVE Final    Comment:        The GeneXpert MRSA Assay (FDA approved for NASAL specimens only), is one component of a comprehensive MRSA colonization surveillance program. It is not intended to diagnose MRSA infection nor to guide or monitor treatment for MRSA infections. RESULT CALLED TO, READ BACK BY AND VERIFIED WITH: PULEO,R. RN @1623  ON 07.20.2020 BY COHEN,K Performed at [facility], [address] [place], [place] [address]   Culture, respiratory (non-expectorated)     Status: None (Preliminary result)   Collection Time: 09/09/18  3:00 PM   Specimen: Tracheal Aspirate; Respiratory  Result Value Ref Range Status   Specimen Description   Final    TRACHEAL ASPIRATE Performed at [facility], [address] [place], [place] [address]    Special Requests   Final    NONE Performed at [facility], [address] [place], [place] [address]    Gram Stain   Final    ABUNDANT WBC PRESENT, PREDOMINANTLY PMN MODERATE YEAST FEW GRAM POSITIVE COCCI IN PAIRS FEW GRAM NEGATIVE RODS    Culture   Final    CULTURE REINCUBATED FOR BETTER GROWTH Performed at [facility], [address] [place], [place] [address]    Report Status PENDING  Incomplete  Blood culture (routine x 2)     Status: None (Preliminary result)   Collection Time: 09/09/18  5:21 PM   Specimen: BLOOD RIGHT HAND  Result Value Ref Range Status   Specimen Description   Final    BLOOD RIGHT HAND Performed at [facility], [address] [place], [place] [address]    Special Requests   Final    BOTTLES DRAWN AEROBIC ONLY Blood Culture results may not be optimal due to an inadequate volume of blood received in culture bottles Performed at [facility], [address] [place], [place] [address]  Culture   Final    NO GROWTH 2 DAYS Performed at [facility], [address] [place], [place] [address]    Report Status PENDING  Incomplete  C difficile quick scan w PCR reflex     Status: None   Collection Time: 09/11/18  4:15 AM   Specimen: STOOL  Result Value Ref Range Status   C Diff antigen NEGATIVE NEGATIVE Final   C Diff toxin NEGATIVE NEGATIVE Final   C Diff interpretation No C. difficile detected.  Final    Comment: Performed at [facility], [address] [place], [place] [address]      Radiology Studies: Dg Chest Port 1 View  Result Date: 09/12/2018 CLINICAL DATA:  Patient admitted 09/09/2018 with encephalopathy and hypoxic respiratory failure. EXAM: PORTABLE CHEST 1 VIEW COMPARISON:  Single-view of the chest 09/09/2018, 07/24/2018. FINDINGS: Endotracheal tube is no longer visualized. Feeding tube and right PICC are unchanged. Right basilar airspace disease persists without notable change. Left lung is clear. Heart size is normal. IMPRESSION: Endotracheal tube is no longer seen. New feeding tube courses into the stomach and below the inferior margin the film. No change in right basilar airspace disease. Electronically Signed   By: Drusilla Kannerhomas  Dalessio M.D.   On: 09/12/2018 07:50   Dg Abd Portable 1v  Result Date: 09/11/2018 CLINICAL DATA:  Check feeding catheter placement EXAM: PORTABLE ABDOMEN - 1 VIEW COMPARISON:  09/09/2018 FINDINGS: Previously seen gastric catheter has been removed and a weighted feeding catheter has now been placed and lies in the mid stomach. The remainder of the exam is stable. IMPRESSION: Weighted feeding catheter now lies in the mid stomach. Electronically Signed   By: Alcide CleverMark  Lukens M.D.   On: 09/11/2018 19:45      LOS: 3 days   Time spent: More than 50% of that time was spent in counseling and/or coordination of care.  Lanae Boastamesh Shali Vesey, MD [facility]  09/12/2018, 8:45 AM

## 2018-09-12 NOTE — Progress Notes (Signed)
eLink Physician-Brief Progress Note Patient Name: Taylor Ruiz DOB: 1935/03/26 MRN: [id number]   Date of Service  09/12/2018  HPI/Events of Note  83 year old female admitted to hospital because of acute respiratory failure.  Potassium 2.9, magnesium 1.7, creatinine 0.6 and phosphorus 1.8  eICU Interventions  Ordered 30 mmol of IV potassium phosphate replacement     Intervention Category Intermediate Interventions: Electrolyte abnormality - evaluation and management;Communication with other healthcare providers and/or family;Medication change / dose adjustment  Ephriam Jenkins Taylor Ruiz 09/12/2018, 6:42 AM

## 2018-09-12 NOTE — Consult Note (Signed)
Consultation Note Date: 09/12/2018   Patient Name: Taylor Ruiz  DOB: 12/21/1935  MRN: [id number]  Age / Sex: 83 y.o., female  PCP: Ralene OkMoreira, Roy, MD Referring Physician: Lanae BoastKc, Ramesh, MD  Reason for Consultation: Establishing goals of care  HPI/Patient Profile: 83 y.o. female  admitted on 09/09/2018    Clinical Assessment and Goals of Care: 83 year old lady who lives in a nursing facility has a history of dementia, has been admitted with right-sided pneumonia deemed likely aspiration pneumonia in etiology.  Hospital course complicated by patient requiring intubation and mechanical ventilation, recently extubated.  Patient known to palliative medicine service, was seen by palliative provider last month in June 2020.  Hospital course also complicated by septic shock, acute kidney injury and electrolyte imbalance.  Initial goals of care discussions undertaken by critical care medicine, DO NOT RESUSCITATE, no reintubation was decided.  Patient since has been transferred to hospital medicine service.  Palliative medicine consultation has been requested for ongoing goals of care conversations.  Patient appears to be a frail elderly lady with dementia.  She is resting in bed.  She has NG tube and tube feeds.  She is getting IV potassium.  She does not become alert she does not follow commands.  Call placed and discussed with primary contact Mr. Vaughan SineWinford.  Palliative medicine is specialized medical care for people living with serious illness. It focuses on providing relief from the symptoms and stress of a serious illness. The goal is to improve quality of life for both the patient and the family.  Goals of care: Broad aims of medical therapy in relation to the patient's values and preferences. Our aim is to provide medical care aimed at enabling patients to achieve the goals that matter most to them, given the  circumstances of their particular medical situation and their constraints.    Discussed with Mr. Vaughan SineWinford over the phone about the patient's current condition, she remains chronically ill, not alert, with high risk of ongoing decline/decompensation.  Rediscussed and re-agreed with him about appropriateness of DO NOT RESUSCITATE/do not reintubate.  He states that he is well aware that the patient is not improving.  She was hospitalized last month for similar conditions.  Please note additional conversations as listed below.   NEXT OF KIN Patient is [place] from [place].  She was moved here less than a year ago by her relatives who live locally in [place].  She has been in a nursing facility.  Cousin, niece and nephew make decisions together. Family member Taylor Ruiz is main contact.   SUMMARY OF RECOMMENDATIONS   Agree with DO NOT RESUSCITATE/DO NOT INTUBATE Continue current mode of care Follow hospital course/disease trajectory, time-limited trial of current interventions.  Should the patient have ongoing decline, would recommend discontinuation of NG tube/tube feeds to focus exclusively on comfort measures. Call placed and discussed with next of kin, family member Schering-PloughBethea Ruiz and discussed the above.  Patient has 2-3 family members who are jointly making decisions for her. Palliative medicine to  continue to follow along.  Thank you for the consult.  Code Status/Advance Care Planning:  DNR    Symptom Management:   As above  Palliative Prophylaxis:   Delirium Protocol   Psycho-social/Spiritual:   Desire for further Chaplaincy support:yes  Additional Recommendations: Education on Hospice  Prognosis:   Guarded   Discharge Planning: To Be Determined      Primary Diagnoses: Present on Admission: . (Resolved) Acute respiratory failure (HCC) . FTT (failure to thrive) in adult   I have reviewed the medical record, interviewed the patient and family, and  examined the patient. The following aspects are pertinent.  Past Medical History:  Diagnosis Date  . Dementia (HCC)   . HTN (hypertension)   . Vertigo    Social History   Socioeconomic History  . Marital status: Widowed    Spouse name: Not on file  . Number of children: Not on file  . Years of education: Not on file  . Highest education level: Not on file  Occupational History  . Not on file  Social Needs  . Financial resource strain: Not on file  . Food insecurity    Worry: Not on file    Inability: Not on file  . Transportation needs    Medical: Not on file    Non-medical: Not on file  Tobacco Use  . Smoking status: Never Smoker  . Smokeless tobacco: Never Used  . Tobacco comment: unsure if ever smoked or used smokeless tobacco  Substance and Sexual Activity  . Alcohol use: Not on file  . Drug use: Not on file  . Sexual activity: Not on file  Lifestyle  . Physical activity    Days per week: Not on file    Minutes per session: Not on file  . Stress: Not on file  Relationships  . Social Musicianconnections    Talks on phone: Not on file    Gets together: Not on file    Attends religious service: Not on file    Active member of club or organization: Not on file    Attends meetings of clubs or organizations: Not on file    Relationship status: Not on file  Other Topics Concern  . Not on file  Social History Narrative  . Not on file   No family history on file. Scheduled Meds: . Chlorhexidine Gluconate Cloth  6 each Topical Daily  . free water  200 mL Per Tube Q4H  . heparin  5,000 Units Subcutaneous Q8H  . mouth rinse  15 mL Mouth Rinse BID  . mupirocin ointment  1 application Nasal BID  . sodium chloride flush  10-40 mL Intracatheter Q12H   Continuous Infusions: . famotidine (PEPCID) IV Stopped (09/12/18 1014)  . feeding supplement (VITAL AF 1.2 CAL) 1,000 mL (09/12/18 0945)  . lactated ringers 50 mL/hr at 09/12/18 1100  . piperacillin-tazobactam (ZOSYN)  IV  Stopped (09/12/18 0943)  . potassium PHOSPHATE IVPB (in mmol) 85 mL/hr at 09/12/18 1100   PRN Meds:.sodium chloride flush Medications Prior to Admission:  Prior to Admission medications   Medication Sig Start Date End Date Taking? Authorizing Provider  acetaminophen (TYLENOL) 325 MG tablet Take 650 mg by mouth every 6 (six) hours as needed for mild pain, moderate pain, fever or headache.   Yes [provider]  Amino Acids-Protein Hydrolys (FEEDING SUPPLEMENT, PRO-STAT SUGAR FREE 64,) LIQD Take 30 mLs by mouth 2 (two) times a day.   Yes [provider]  amLODipine (NORVASC) 10  MG tablet Take 10 mg by mouth daily.   Yes [provider]  Brexpiprazole (REXULTI) 0.5 MG TABS Take 1 mg by mouth at bedtime.   Yes [provider]  donepezil (ARICEPT) 5 MG tablet Take 5 mg by mouth at bedtime.   Yes [provider]  meclizine (ANTIVERT) 25 MG tablet Take 25 mg by mouth 2 (two) times daily.   Yes [provider]  metoprolol tartrate (LOPRESSOR) 25 MG tablet Take 1 tablet (25 mg total) by mouth 2 (two) times daily. 06/25/18  Yes Oretha Milch D, MD  OXYGEN Inhale 2 L into the lungs continuous.   Yes [provider]  pregabalin (LYRICA) 50 MG capsule Take 50 mg by mouth at bedtime.   Yes [provider]  valACYclovir (VALTREX) 500 MG tablet Take 500 mg by mouth 2 (two) times daily.   Yes [provider]   No Known Allergies Review of Systems Does not open eyes does not verbalize Physical Exam Frail elderly lady resting in bed Resting in bed with eyes closed, grimaces some and for arouse her eyebrows when her name is called. Does not follow commands Decreased breath sounds bilaterally S1-S2 Patient has NG tube and tube feeds going.  Vital Signs: BP (!) 106/51   Pulse 94   Temp 99.3 F (37.4 C)   Resp (!) 31   Ht 5\' 8"  (1.727 m)   Wt 67 kg   SpO2 97%   BMI 22.46 kg/m  Pain Scale: PAINAD   Pain Score: Asleep    SpO2: SpO2: 97 % O2 Device:SpO2: 97 % O2 Flow Rate: .O2 Flow Rate (L/min): 2 L/min  IO: Intake/output summary:   Intake/Output Summary (Last 24 hours) at 09/12/2018 1153 Last data filed at 09/12/2018 1100 Gross per 24 hour  Intake 3004.29 ml  Output 2150 ml  Net 854.29 ml    LBM: Last BM Date: 09/12/18 Baseline Weight: Weight: 60.4 kg Most recent weight: Weight: 67 kg     Palliative Assessment/Data:   Flowsheet Rows     Most Recent Value  Intake Tab  Referral Department  Hospitalist  Unit at Time of Referral  Intermediate Care Unit  Palliative Care Primary Diagnosis  Neurology  Palliative Care Type  New Palliative care  Reason for referral  Clarify Goals of Care  Date first seen by Palliative Care  09/12/18  Clinical Assessment  Palliative Performance Scale Score  20%  Pain Max last 24 hours  5  Pain Min Last 24 hours  4  Dyspnea Max Last 24 Hours  5  Dyspnea Min Last 24 hours  4  Psychosocial & Spiritual Assessment  Palliative Care Outcomes      Time In:  10 Time Out:  11 Time Total:  60  Greater than 50%  of this time was spent counseling and coordinating care related to the above assessment and plan.  Signed by: Loistine Chance, MD [phone]  Please contact Palliative Medicine Team phone at [phone] for questions and concerns.  For individual provider: See Shea Evans

## 2018-09-13 DIAGNOSIS — G9341 Metabolic encephalopathy: Secondary | ICD-10-CM

## 2018-09-13 LAB — CBC
HCT: 26.7 % — ABNORMAL LOW (ref 36.0–46.0)
Hemoglobin: 8.3 g/dL — ABNORMAL LOW (ref 12.0–15.0)
MCH: 30.1 pg (ref 26.0–34.0)
MCHC: 31.1 g/dL (ref 30.0–36.0)
MCV: 96.7 fL (ref 80.0–100.0)
Platelets: 106 10*3/uL — ABNORMAL LOW (ref 150–400)
RBC: 2.76 MIL/uL — ABNORMAL LOW (ref 3.87–5.11)
RDW: 18.6 % — ABNORMAL HIGH (ref 11.5–15.5)
WBC: 10.2 10*3/uL (ref 4.0–10.5)
nRBC: 0.4 % — ABNORMAL HIGH (ref 0.0–0.2)

## 2018-09-13 LAB — BASIC METABOLIC PANEL
Anion gap: 8 (ref 5–15)
BUN: 12 mg/dL (ref 8–23)
CO2: 19 mmol/L — ABNORMAL LOW (ref 22–32)
Calcium: 7.7 mg/dL — ABNORMAL LOW (ref 8.9–10.3)
Chloride: 114 mmol/L — ABNORMAL HIGH (ref 98–111)
Creatinine, Ser: 0.47 mg/dL (ref 0.44–1.00)
GFR calc Af Amer: >60 mL/min (ref >60)
GFR calc non Af Amer: >60 mL/min (ref >60)
Glucose, Bld: 97 mg/dL (ref 70–99)
Potassium: 3.3 mmol/L — ABNORMAL LOW (ref 3.5–5.1)
Sodium: 141 mmol/L (ref 135–145)

## 2018-09-13 LAB — GLUCOSE, CAPILLARY
Glucose-Capillary: 94 mg/dL (ref 70–99)
Glucose-Capillary: 97 mg/dL (ref 70–99)
Glucose-Capillary: 100 mg/dL — ABNORMAL HIGH (ref 70–99)

## 2018-09-13 LAB — MAGNESIUM: Magnesium: 1.4 mg/dL — ABNORMAL LOW (ref 1.7–2.4)

## 2018-09-13 LAB — PHOSPHORUS: Phosphorus: 1.7 mg/dL — ABNORMAL LOW (ref 2.5–4.6)

## 2018-09-13 MED ORDER — POTASSIUM CHLORIDE 10 MEQ/100ML IV SOLN
10.0000 meq | INTRAVENOUS | Status: AC
  Administered 2018-09-13 (×2): 10 meq via INTRAVENOUS
  Filled 2018-09-13 (×2): qty 100

## 2018-09-13 MED ORDER — MAGNESIUM SULFATE 2 GM/50ML IV SOLN
2.0000 g | Freq: Once | INTRAVENOUS | Status: AC
  Administered 2018-09-13: 2 g via INTRAVENOUS
  Filled 2018-09-13: qty 50

## 2018-09-13 MED ORDER — POTASSIUM PHOSPHATES 15 MMOLE/5ML IV SOLN
30.0000 mmol | Freq: Once | INTRAVENOUS | Status: AC
  Administered 2018-09-13: 30 mmol via INTRAVENOUS
  Filled 2018-09-13: qty 10

## 2018-09-13 NOTE — Progress Notes (Signed)
PROGRESS NOTE    Taylor Ruiz  ONG:[id number] DOB: 05/06/1935 DOA: 09/09/2018 PCP: Taylor OkMoreira, Roy, MD   Brief Narrative: 83 year old female with history of dementia, history of aspiration pneumonia presents with hypoxia, increased work of breathing and altered mental status on 7/20, admitted intubated, volume resuscitated and antibiotics were started.  Initially on pressors- stopped 7/21, had diarrhea tested for C. difficile, 7/22 extubated, vancomycin was stopped changed her to DNR/DNI and transferred to hospital service 7/23  Subjective: More alert today- abel to mumble few words. RN reports may be slightly better. No acute events overnight No fever. Pt still not able to interact or follow commands When asked her name , mumbled "I don't know"  Assessment & Plan:  Acute hypoxic respiratory failure with aspiration pneumonia: resolved. on room air.  Impression overall improving, leukocytosis has resolved. Cont Zosyn for pneumonia. Continue aspiration precaution, supportive care.  NG tube in place with tube feed. SLP eval once more awake for feeding trial. Recent Labs  Lab 09/09/18 0704 09/11/18 1153 09/12/18 0427 09/13/18 0447  WBC 17.7* 16.9* 12.9* 10.2   Acute metabolic encephalopathy in the setting of dementia: Patient appears to have some improvement. Holding p.o. meds has NG tube in place. Cont supportive care.  Septic shock due to #1 on also with volume depletion needing pressors. BP stable, afebrile and wbc normal now. Continue antibiotics-Zosyn, throat culture:Gram stain.  Gram-positive cocci in pairs, few gram-negative rods consistent with normal respiratory flora.  Blood cultures 7/20 no growth so far.  AKI due to sepsis volume depletion:Resolved. Recent Labs  Lab 09/10/18 0822 09/10/18 1200 09/11/18 0420 09/12/18 0427 09/13/18 0447  BUN 49* 39* 31* 18 12  CREATININE 1.26* 0.96 0.81  0.76 0.64 0.47   Hypokalemia/hypophosphatemia/hypomagnesemia:  Again repleting  iv-recheck in am.   DVT prophylaxis: Heparin Code Status:DNR  Family Communication:palliative care consulted-family has been updated by palliative care and if patient further declines they agree to discontinue NG tube and focus on comfort. I tried niece Taylor Ruiz 7/23 but  no answer-spoke to her and updated: She request Cousin Taylor Ruiz to be primary contact. Disposition Plan: Remains inpatient pending clinical improvement.[facility]  Consultants:   PCCM Palliative care  Procedures:  ETT-off 7/22  Microbiology: COVID 19 neg  Throat culture 7/20:Gram stain.  Gram-positive cocci in pairs, few gram-negative rods consistent with normal respiratory flora.  Blood cultures 7/20 no growth so far.  Antimicrobials: Anti-infectives (From admission, onward)   Start     Dose/Rate Route Frequency Ordered Stop   09/10/18 1000  vancomycin (VANCOCIN) IVPB 1000 mg/200 mL premix  Status:  Discontinued     1,000 mg 200 mL/hr over 60 Minutes Intravenous Every 36 hours 09/09/18 1433 09/11/18 1044   09/09/18 2200  piperacillin-tazobactam (ZOSYN) IVPB 3.375 g     3.375 g 12.5 mL/hr over 240 Minutes Intravenous Every 8 hours 09/09/18 1213     09/09/18 1600  piperacillin-tazobactam (ZOSYN) IVPB 3.375 g     3.375 g 100 mL/hr over 30 Minutes Intravenous  Once 09/09/18 1213 09/09/18 1650   09/09/18 0730  vancomycin (VANCOCIN) IVPB 1000 mg/200 mL premix     1,000 mg 200 mL/hr over 60 Minutes Intravenous  Once 09/09/18 0719 09/09/18 1230   09/09/18 0730  ceFEPIme (MAXIPIME) 2 g in sodium chloride 0.9 % 100 mL IVPB     2 g 200 mL/hr over 30 Minutes Intravenous  Once 09/09/18 0719 09/09/18 0850   09/09/18 0730  metroNIDAZOLE (FLAGYL) IVPB 500 mg  500 mg 100 mL/hr over 60 Minutes Intravenous  Once 09/09/18 0719 09/09/18 1006       Objective: Vitals:   09/13/18 0100 09/13/18 0300 09/13/18 0400 09/13/18 0413  BP:   (!) 147/47   Pulse:  87 (!) 102   Resp:   (!) 30   Temp: 99.9 F (37.7  C) 99.9 F (37.7 C) 100 F (37.8 C)   TempSrc:      SpO2:   97%   Weight:    69.5 kg  Height:        Intake/Output Summary (Last 24 hours) at 09/13/2018 0803 Last data filed at 09/13/2018 0534 Gross per 24 hour  Intake 3203.75 ml  Output 2350 ml  Net 853.75 ml   Filed Weights   09/11/18 0359 09/12/18 0500 09/13/18 0413  Weight: 67.5 kg 67 kg 69.5 kg   Weight change: 2.5 kg  Body mass index is 23.3 kg/m.  Intake/Output from previous day: 07/23 0701 - 07/24 0700 In: 3528.5 [I.V.:1098.8; NG/GT:1500; IV Piggyback:929.7] Out: 2350 [Urine:1700; Stool:650] Intake/Output this shift: No intake/output data recorded.  Examination:  General exam: eyes able to open, mumbled few words. not following commands, NGT+ HEENT:PERRL,Oral mucosa moist, Ear/Nose normal on gross exam Respiratory system: Bilateral equal air entry, normal vesicular breath sounds, no wheezes or crackles  Cardiovascular system: S1 & S2 heard,No JVD, murmurs. Gastrointestinal system: Abdomen is  soft, non tender, non distended, BS +  Nervous System: eyes able to open, mumbled few words- doesn't;t follow commands Extremities: trace leg edema, distal pulses+ Skin: No rashes, lesions, no icterus MSK: Normal muscle bulk,tone ,power  Medications:  Scheduled Meds: . Chlorhexidine Gluconate Cloth  6 each Topical Daily  . free water  200 mL Per Tube Q4H  . heparin  5,000 Units Subcutaneous Q8H  . mouth rinse  15 mL Mouth Rinse BID  . mupirocin ointment  1 application Nasal BID  . sodium chloride flush  10-40 mL Intracatheter Q12H   Continuous Infusions: . famotidine (PEPCID) IV Stopped (09/12/18 2227)  . feeding supplement (VITAL AF 1.2 CAL) 55 mL/hr at 09/13/18 0300  . lactated ringers 50 mL/hr at 09/13/18 0401  . magnesium sulfate bolus IVPB    . piperacillin-tazobactam (ZOSYN)  IV 3.375 g (09/13/18 0534)  . potassium chloride    . potassium PHOSPHATE IVPB (in mmol)      Data Reviewed: I have personally  reviewed following labs and imaging studies  CBC: Recent Labs  Lab 09/09/18 0704 09/11/18 1153 09/12/18 0427 09/13/18 0447  WBC 17.7* 16.9* 12.9* 10.2  NEUTROABS 14.7*  --   --   --   HGB 14.7 8.9* 8.3* 8.3*  HCT 50.2* 29.8* 27.3* 26.7*  MCV 102.9* 100.0 98.9 96.7  PLT 208 PLATELET CLUMPS NOTED ON SMEAR, UNABLE TO ESTIMATE 98* 106*   Basic Metabolic Panel: Recent Labs  Lab 09/09/18 1724  09/10/18 0822 09/10/18 1200 09/11/18 0420 09/12/18 0427 09/13/18 0447  NA 158*   < > 153* 148* 147* 141 141  K 3.8   < > 3.5 3.4* 3.0* 2.9* 3.3*  CL 127*   < > 126* 121* 122* 115* 114*  CO2 19*   < > 18* 18* 17* 18* 19*  GLUCOSE 144*   < > 141* 100* 111* 98 97  BUN 48*   < > 49* 39* 31* 18 12  CREATININE 1.53*   < > 1.26* 0.96 0.81  0.76 0.64 0.47  CALCIUM 9.4   < > 8.7* 8.0* 8.0* 7.7*  7.7*  MG 2.2  --  1.9  --  1.5* 1.7 1.4*  PHOS 2.5  --  <1.0*  --  1.9* 1.8* 1.7*   < > = values in this interval not displayed.   GFR: Estimated Creatinine Clearance: 55.6 mL/min (by C-G formula based on SCr of 0.47 mg/dL). Liver Function Tests: Recent Labs  Lab 09/09/18 0704  AST 17  ALT 10  ALKPHOS 52  BILITOT 0.6  PROT 8.7*  ALBUMIN 3.6   No results for input(s): LIPASE, AMYLASE in the last 168 hours. No results for input(s): AMMONIA in the last 168 hours. Coagulation Profile: No results for input(s): INR, PROTIME in the last 168 hours. Cardiac Enzymes: No results for input(s): CKTOTAL, CKMB, CKMBINDEX, TROPONINI in the last 168 hours. BNP (last 3 results) No results for input(s): PROBNP in the last 8760 hours. HbA1C: No results for input(s): HGBA1C in the last 72 hours. CBG: Recent Labs  Lab 09/12/18 1615 09/12/18 1933 09/12/18 2344 09/13/18 0410 09/13/18 0740  GLUCAP 102* 99 102* 94 97   Lipid Profile: No results for input(s): CHOL, HDL, LDLCALC, TRIG, CHOLHDL, LDLDIRECT in the last 72 hours. Thyroid Function Tests: No results for input(s): TSH, T4TOTAL, FREET4, T3FREE,  THYROIDAB in the last 72 hours. Anemia Panel: No results for input(s): VITAMINB12, FOLATE, FERRITIN, TIBC, IRON, RETICCTPCT in the last 72 hours. Sepsis Labs: Recent Labs  Lab 09/09/18 1403 09/09/18 1724 09/10/18 0822 09/11/18 0420  PROCALCITON 1.61  --  2.00 1.25  LATICACIDVEN  --  4.4* 3.3*  --     Recent Results (from the past 240 hour(s))  SARS Coronavirus 2 (CEPHEID - Performed in [facility] hospital lab), Hosp Order     Status: None   Collection Time: 09/09/18  6:48 AM   Specimen: Nasopharyngeal Swab  Result Value Ref Range Status   SARS Coronavirus 2 NEGATIVE NEGATIVE Final    Comment: (NOTE) If result is NEGATIVE SARS-CoV-2 target nucleic acids are NOT DETECTED. The SARS-CoV-2 RNA is generally detectable in upper and lower  respiratory specimens during the acute phase of infection. The lowest  concentration of SARS-CoV-2 viral copies this assay can detect is 250  copies / mL. A negative result does not preclude SARS-CoV-2 infection  and should not be used as the sole basis for treatment or other  patient management decisions.  A negative result may occur with  improper specimen collection / handling, submission of specimen other  than nasopharyngeal swab, presence of viral mutation(s) within the  areas targeted by this assay, and inadequate number of viral copies  (<250 copies / mL). A negative result must be combined with clinical  observations, patient history, and epidemiological information. If result is POSITIVE SARS-CoV-2 target nucleic acids are DETECTED. The SARS-CoV-2 RNA is generally detectable in upper and lower  respiratory specimens dur ing the acute phase of infection.  Positive  results are indicative of active infection with SARS-CoV-2.  Clinical  correlation with patient history and other diagnostic information is  necessary to determine patient infection status.  Positive results do  not rule out bacterial infection or co-infection with other  viruses. If result is PRESUMPTIVE POSTIVE SARS-CoV-2 nucleic acids MAY BE PRESENT.   A presumptive positive result was obtained on the submitted specimen  and confirmed on repeat testing.  While 2019 novel coronavirus  (SARS-CoV-2) nucleic acids may be present in the submitted sample  additional confirmatory testing may be necessary for epidemiological  and / or clinical management purposes  to  differentiate between  SARS-CoV-2 and other Sarbecovirus currently known to infect humans.  If clinically indicated additional testing with an alternate test  methodology [phone]([id number]) is advised. The SARS-CoV-2 RNA is generally  detectable in upper and lower respiratory sp ecimens during the acute  phase of infection. The expected result is Negative. Fact Sheet for Patients:  BoilerBrush.com.cyhttps://www.fda.gov/media/136312/download Fact Sheet for Healthcare Providers: https://pope.com/https://www.fda.gov/media/136313/download This test is not yet approved or cleared by the [place] FDA and has been authorized for detection and/or diagnosis of SARS-CoV-2 by FDA under an Emergency Use Authorization (EUA).  This EUA will remain in effect (meaning this test can be used) for the duration of the COVID-19 declaration under Section 564(b)(1) of the Act, 21 U.S.C. section 360bbb-3(b)(1), unless the authorization is terminated or revoked sooner. Performed at [facility], [address] [place], [place] [address]   Blood culture (routine x 2)     Status: None (Preliminary result)   Collection Time: 09/09/18  6:53 AM   Specimen: BLOOD  Result Value Ref Range Status   Specimen Description   Final    BLOOD RIGHT ANTECUBITAL Performed at [facility], [address] [place], [place] [address]    Special Requests   Final    BOTTLES DRAWN AEROBIC AND ANAEROBIC Blood Culture adequate volume Performed at [facility], [address] [place], [place] [address]    Culture   Final    NO  GROWTH 3 DAYS Performed at [facility], [address] [place], [place] [address]    Report Status PENDING  Incomplete  MRSA PCR Screening     Status: Abnormal   Collection Time: 09/09/18  2:02 PM   Specimen: Nasal Mucosa; Nasopharyngeal  Result Value Ref Range Status   MRSA by PCR POSITIVE (A) NEGATIVE Final    Comment:        The GeneXpert MRSA Assay (FDA approved for NASAL specimens only), is one component of a comprehensive MRSA colonization surveillance program. It is not intended to diagnose MRSA infection nor to guide or monitor treatment for MRSA infections. RESULT CALLED TO, READ BACK BY AND VERIFIED WITH: PULEO,R. RN @1623  ON 07.20.2020 BY COHEN,K Performed at [facility], [address] [place], [place] [address]   Culture, respiratory (non-expectorated)     Status: None   Collection Time: 09/09/18  3:00 PM   Specimen: Tracheal Aspirate; Respiratory  Result Value Ref Range Status   Specimen Description   Final    TRACHEAL ASPIRATE Performed at [facility], [address] [place], [place] [address]    Special Requests   Final    NONE Performed at [facility], [address] [place], [place] [address]    Gram Stain   Final    ABUNDANT WBC PRESENT, PREDOMINANTLY PMN MODERATE YEAST FEW GRAM POSITIVE COCCI IN PAIRS FEW GRAM NEGATIVE RODS    Culture   Final    Consistent with normal respiratory flora. Performed at [facility], [address] [place], [place] [address]    Report Status 09/12/2018 FINAL  Final  Blood culture (routine x 2)     Status: None (Preliminary result)   Collection Time: 09/09/18  5:21 PM   Specimen: BLOOD RIGHT HAND  Result Value Ref Range Status   Specimen Description   Final    BLOOD RIGHT HAND Performed at [facility], [address] [place], [place] [address]  Special Requests   Final    BOTTLES DRAWN AEROBIC ONLY Blood Culture results may not  be optimal due to an inadequate volume of blood received in culture bottles Performed at [facility] [address], [place], [place] [address]    Culture   Final    NO GROWTH 3 DAYS Performed at [facility], [place] [address] [place], [place] [address]    Report Status PENDING  Incomplete  C difficile quick scan w PCR reflex     Status: None   Collection Time: 09/11/18  4:15 AM   Specimen: STOOL  Result Value Ref Range Status   C Diff antigen NEGATIVE NEGATIVE Final   C Diff toxin NEGATIVE NEGATIVE Final   C Diff interpretation No C. difficile detected.  Final    Comment: Performed at [facility], [place] [address], [place], [place] [address]      Radiology Studies: Dg Chest Port 1 View  Result Date: 09/12/2018 CLINICAL DATA:  Patient admitted 09/09/2018 with encephalopathy and hypoxic respiratory failure. EXAM: PORTABLE CHEST 1 VIEW COMPARISON:  Single-view of the chest 09/09/2018, 07/24/2018. FINDINGS: Endotracheal tube is no longer visualized. Feeding tube and right PICC are unchanged. Right basilar airspace disease persists without notable change. Left lung is clear. Heart size is normal. IMPRESSION: Endotracheal tube is no longer seen. New feeding tube courses into the stomach and below the inferior margin the film. No change in right basilar airspace disease. Electronically Signed   By: Inge Rise M.D.   On: 09/12/2018 07:50   Dg Abd Portable 1v  Result Date: 09/11/2018 CLINICAL DATA:  Check feeding catheter placement EXAM: PORTABLE ABDOMEN - 1 VIEW COMPARISON:  09/09/2018 FINDINGS: Previously seen gastric catheter has been removed and a weighted feeding catheter has now been placed and lies in the mid stomach. The remainder of the exam is stable. IMPRESSION: Weighted feeding catheter now lies in the mid stomach. Electronically Signed   By: Inez Catalina M.D.   On: 09/11/2018 19:45      LOS: 4 days   Time spent: More than 50% of  that time was spent in counseling and/or coordination of care.  Antonieta Pert, MD [facility]  09/13/2018, 8:03 AM

## 2018-09-13 NOTE — Progress Notes (Signed)
°  [facility] ([facility]) Hospital Liaison note.? ° ? °This patient was recently enrolled in our Palliative Care program. She was scheduled for her first visit but was admitted to the hospital before that took place.? ° ? °[facility] will continue to follow while hospitalized. Our Palliative team will follow up with her after discharge.? ° ? °If you have any questions please contact us.? ° ? °Thank you,? ° ? °Sherry Gibson, RN °[facility] Hospital Liaison (listed on AMION)? °[phone]? °

## 2018-09-14 LAB — CULTURE, BLOOD (ROUTINE X 2)
Culture: NO GROWTH
Culture: NO GROWTH
Special Requests: ADEQUATE

## 2018-09-14 LAB — BASIC METABOLIC PANEL
Anion gap: 5 (ref 5–15)
BUN: 12 mg/dL (ref 8–23)
CO2: 21 mmol/L — ABNORMAL LOW (ref 22–32)
Calcium: 8.3 mg/dL — ABNORMAL LOW (ref 8.9–10.3)
Chloride: 116 mmol/L — ABNORMAL HIGH (ref 98–111)
Creatinine, Ser: 0.45 mg/dL (ref 0.44–1.00)
GFR calc Af Amer: >60 mL/min (ref >60)
GFR calc non Af Amer: >60 mL/min (ref >60)
Glucose, Bld: 100 mg/dL — ABNORMAL HIGH (ref 70–99)
Potassium: 3.9 mmol/L (ref 3.5–5.1)
Sodium: 142 mmol/L (ref 135–145)

## 2018-09-14 LAB — GLUCOSE, CAPILLARY
Glucose-Capillary: 98 mg/dL (ref 70–99)
Glucose-Capillary: 93 mg/dL (ref 70–99)
Glucose-Capillary: 87 mg/dL (ref 70–99)
Glucose-Capillary: 102 mg/dL — ABNORMAL HIGH (ref 70–99)
Glucose-Capillary: 93 mg/dL (ref 70–99)

## 2018-09-14 LAB — CBC
HCT: 27.2 % — ABNORMAL LOW (ref 36.0–46.0)
Hemoglobin: 8.5 g/dL — ABNORMAL LOW (ref 12.0–15.0)
MCH: 30.4 pg (ref 26.0–34.0)
MCHC: 31.3 g/dL (ref 30.0–36.0)
MCV: 97.1 fL (ref 80.0–100.0)
Platelets: 124 10*3/uL — ABNORMAL LOW (ref 150–400)
RBC: 2.8 MIL/uL — ABNORMAL LOW (ref 3.87–5.11)
RDW: 19.1 % — ABNORMAL HIGH (ref 11.5–15.5)
WBC: 10.7 10*3/uL — ABNORMAL HIGH (ref 4.0–10.5)
nRBC: 0.3 % — ABNORMAL HIGH (ref 0.0–0.2)

## 2018-09-14 LAB — MAGNESIUM: Magnesium: 1.9 mg/dL (ref 1.7–2.4)

## 2018-09-14 LAB — PHOSPHORUS: Phosphorus: 2.3 mg/dL — ABNORMAL LOW (ref 2.5–4.6)

## 2018-09-14 NOTE — Progress Notes (Signed)
Pt refused to allow nursing staff to check her blood sugar for 1200.

## 2018-09-14 NOTE — Progress Notes (Signed)
PROGRESS NOTE    Taylor Ruiz  ZOX:[id number] DOB: 12/02/1935 DOA: 09/09/2018 PCP: Ralene OkMoreira, Roy, MD   Brief Narrative: 83 year old female with history of dementia, history of aspiration pneumonia presents with hypoxia, increased work of breathing and altered mental status on 7/20, admitted intubated, volume resuscitated and antibiotics were started.  Initially on pressors- stopped 7/21, had diarrhea tested for C. difficile, 7/22 extubated, vancomycin was stopped changed her to DNR/DNI and transferred to hospital service 7/23  Subjective: Patient appears more alert awake, mumbling words.  Does not follow commands, refusing sugar check. Remains on NG tube feeding.  Assessment & Plan:  Acute hypoxic respiratory failure with aspiration pneumonia: resolved. on room air.  Impression overall improving, leukocytosis has resolved. Cont Zosyn for pneumonia. Continue aspiration precaution, supportive care.  NG tube in place with tube feed. SLP eval once more awake for feeding trial. Recent Labs  Lab 09/09/18 0704 09/11/18 1153 09/12/18 0427 09/13/18 0447 09/14/18 0454  WBC 17.7* 16.9* 12.9* 10.2 10.7*   Acute metabolic encephalopathy in the setting of dementia: Patient appears to have helped some improvement, obtain a speech eval for oral diet assessment. NG tube in place. Cont supportive care.  Septic shock due to #1 on also with volume depletion needing pressors. BP stable, afebrile and wbc normal now. Continue antibiotics-Zosyn, throat culture:Gram stain.  Gram-positive cocci in pairs, few gram-negative rods consistent with normal respiratory flora.  Blood cultures 7/20 no growth so far.  AKI due to sepsis volume depletion:Resolved. Recent Labs  Lab 09/10/18 1200 09/11/18 0420 09/12/18 0427 09/13/18 0447 09/14/18 0454  BUN 39* 31* 18 12 12   CREATININE 0.96 0.81  0.76 0.64 0.47 0.45   Hypokalemia/hypophosphatemia/hypomagnesemia: Electrolytes have improved.    DVT prophylaxis:  Heparin Code Status:DNR  Family Communication:palliative care consulted-family has been updated by palliative care and if patient further declines they agree to discontinue NG tube and focus on comfort. I tried niece Doristine CounterBertha 7/23 but  no answer-spoke to her and updated 7/24.She request Cousin Winford to be primary contact. Noted a hospice note that patient was recently enrolled in palliative care program was scheduled to have first visit but was admitted to the hospital prior to that.  Disposition Plan: Remains inpatient pending clinical improvement.  Consultants:   PCCM Palliative care  Procedures:  ETT-off 7/22  Microbiology: COVID 19 neg  Throat culture 7/20:Gram stain.  Gram-positive cocci in pairs, few gram-negative rods consistent with normal respiratory flora.  Blood cultures 7/20 no growth so far.  Antimicrobials: Anti-infectives (From admission, onward)   Start     Dose/Rate Route Frequency Ordered Stop   09/10/18 1000  vancomycin (VANCOCIN) IVPB 1000 mg/200 mL premix  Status:  Discontinued     1,000 mg 200 mL/hr over 60 Minutes Intravenous Every 36 hours 09/09/18 1433 09/11/18 1044   09/09/18 2200  piperacillin-tazobactam (ZOSYN) IVPB 3.375 g     3.375 g 12.5 mL/hr over 240 Minutes Intravenous Every 8 hours 09/09/18 1213     09/09/18 1600  piperacillin-tazobactam (ZOSYN) IVPB 3.375 g     3.375 g 100 mL/hr over 30 Minutes Intravenous  Once 09/09/18 1213 09/09/18 1650   09/09/18 0730  vancomycin (VANCOCIN) IVPB 1000 mg/200 mL premix     1,000 mg 200 mL/hr over 60 Minutes Intravenous  Once 09/09/18 0719 09/09/18 1230   09/09/18 0730  ceFEPIme (MAXIPIME) 2 g in sodium chloride 0.9 % 100 mL IVPB     2 g 200 mL/hr over 30 Minutes Intravenous  Once 09/09/18 0719  09/09/18 0850   09/09/18 0730  metroNIDAZOLE (FLAGYL) IVPB 500 mg     500 mg 100 mL/hr over 60 Minutes Intravenous  Once 09/09/18 0719 09/09/18 1006       Objective: Vitals:   09/13/18 1524 09/13/18 2232  09/14/18 0511 09/14/18 1349  BP: (!) 125/59 (!) 134/56 136/71 130/67  Pulse: 96 (!) 101 (!) 103 98  Resp: (!) Temp: 98.4 F (36.9 C) 98.2 F (36.8 C) 99 F (37.2 C) 98.7 F (37.1 C)  TempSrc: Axillary Oral Oral Oral  SpO2: 98% 99% 100% 100%  Weight:   70.9 kg   Height:        Intake/Output Summary (Last 24 hours) at 09/14/2018 1403 Last data filed at 09/14/2018 1350 Gross per 24 hour  Intake 3256.6 ml  Output 3075 ml  Net 181.6 ml   Filed Weights   09/12/18 0500 09/13/18 0413 09/14/18 0511  Weight: 67 kg 69.5 kg 70.9 kg   Weight change: 1.4 kg  Body mass index is 23.77 kg/m.  Intake/Output from previous day: 07/24 0701 - 07/25 0700 In: 5070.1 [I.V.:1249.1; NG/GT:2816.3; IV Piggyback:1004.8] Out: 2075 [Urine:1975; Stool:100] Intake/Output this shift: Total I/O In: 634.2 [I.V.:199.2; NG/GT:385; IV Piggyback:50] Out: 1000 [Urine:1000]  Examination:  General exam: Opens eyes mumbles words does not follow commands.NGT+  HEENT:PERRL,Oral mucosa moist, Ear/Nose normal on gross exam Respiratory system: Bilateral equal air entry, normal vesicular breath sounds, no wheezes or crackles  Cardiovascular system: S1 & S2 heard,No JVD, murmurs. Gastrointestinal system: Abdomen is  soft, non tender, non distended, BS +  Nervous System: Opens eyes mumbles words but does not follow commands.   Extremities: trace leg edema, distal pulses+ Skin: No rashes, lesions, no icterus MSK: Normal muscle bulk,tone ,power  Medications:  Scheduled Meds: . Chlorhexidine Gluconate Cloth  6 each Topical Daily  . free water  200 mL Per Tube Q4H  . heparin  5,000 Units Subcutaneous Q8H  . mouth rinse  15 mL Mouth Rinse BID  . sodium chloride flush  10-40 mL Intracatheter Q12H   Continuous Infusions: . famotidine (PEPCID) IV 20 mg (09/14/18 1022)  . feeding supplement (VITAL AF 1.2 CAL) 55 mL/hr at 09/13/18 1800  . lactated ringers 50 mL/hr at 09/14/18 0601  .  piperacillin-tazobactam (ZOSYN)  IV 3.375 g (09/14/18 0533)    Data Reviewed: I have personally reviewed following labs and imaging studies  CBC: Recent Labs  Lab 09/09/18 0704 09/11/18 1153 09/12/18 0427 09/13/18 0447 09/14/18 0454  WBC 17.7* 16.9* 12.9* 10.2 10.7*  NEUTROABS 14.7*  --   --   --   --   HGB 14.7 8.9* 8.3* 8.3* 8.5*  HCT 50.2* 29.8* 27.3* 26.7* 27.2*  MCV 102.9* 100.0 98.9 96.7 97.1  PLT 208 PLATELET CLUMPS NOTED ON SMEAR, UNABLE TO ESTIMATE 98* 106* 124*   Basic Metabolic Panel: Recent Labs  Lab 09/10/18 0822 09/10/18 1200 09/11/18 0420 09/12/18 0427 09/13/18 0447 09/14/18 0454  NA 153* 148* 147* 141 141 142  K 3.5 3.4* 3.0* 2.9* 3.3* 3.9  CL 126* 121* 122* 115* 114* 116*  CO2 18* 18* 17* 18* 19* 21*  GLUCOSE 141* 100* 111* 98 97 100*  BUN 49* 39* 31* CREATININE 1.26* 0.96 0.81  0.76 0.64 0.47 0.45  CALCIUM 8.7* 8.0* 8.0* 7.7* 7.7* 8.3*  MG 1.9  --  1.5* 1.7 1.4* 1.9  PHOS <1.0*  --  1.9* 1.8* 1.7* 2.3*   GFR: Estimated Creatinine Clearance:  55.6 mL/min (by C-G formula based on SCr of 0.45 mg/dL). Liver Function Tests: Recent Labs  Lab 09/09/18 0704  AST 17  ALT 10  ALKPHOS 52  BILITOT 0.6  PROT 8.7*  ALBUMIN 3.6   No results for input(s): LIPASE, AMYLASE in the last 168 hours. No results for input(s): AMMONIA in the last 168 hours. Coagulation Profile: No results for input(s): INR, PROTIME in the last 168 hours. Cardiac Enzymes: No results for input(s): CKTOTAL, CKMB, CKMBINDEX, TROPONINI in the last 168 hours. BNP (last 3 results) No results for input(s): PROBNP in the last 8760 hours. HbA1C: No results for input(s): HGBA1C in the last 72 hours. CBG: Recent Labs  Lab 09/13/18 0740 09/13/18 1131 09/13/18 1636 09/14/18 0508 09/14/18 0710  GLUCAP 97 98 100* 93 87   Lipid Profile: No results for input(s): CHOL, HDL, LDLCALC, TRIG, CHOLHDL, LDLDIRECT in the last 72 hours. Thyroid Function Tests: No results for  input(s): TSH, T4TOTAL, FREET4, T3FREE, THYROIDAB in the last 72 hours. Anemia Panel: No results for input(s): VITAMINB12, FOLATE, FERRITIN, TIBC, IRON, RETICCTPCT in the last 72 hours. Sepsis Labs: Recent Labs  Lab 09/09/18 1403 09/09/18 1724 09/10/18 0822 09/11/18 0420  PROCALCITON 1.61  --  2.00 1.25  LATICACIDVEN  --  4.4* 3.3*  --     Recent Results (from the past 240 hour(s))  SARS Coronavirus 2 (CEPHEID - Performed in [facility] hospital lab), Hosp Order     Status: None   Collection Time: 09/09/18  6:48 AM   Specimen: Nasopharyngeal Swab  Result Value Ref Range Status   SARS Coronavirus 2 NEGATIVE NEGATIVE Final    Comment: (NOTE) If result is NEGATIVE SARS-CoV-2 target nucleic acids are NOT DETECTED. The SARS-CoV-2 RNA is generally detectable in upper and lower  respiratory specimens during the acute phase of infection. The lowest  concentration of SARS-CoV-2 viral copies this assay can detect is 250  copies / mL. A negative result does not preclude SARS-CoV-2 infection  and should not be used as the sole basis for treatment or other  patient management decisions.  A negative result may occur with  improper specimen collection / handling, submission of specimen other  than nasopharyngeal swab, presence of viral mutation(s) within the  areas targeted by this assay, and inadequate number of viral copies  (<250 copies / mL). A negative result must be combined with clinical  observations, patient history, and epidemiological information. If result is POSITIVE SARS-CoV-2 target nucleic acids are DETECTED. The SARS-CoV-2 RNA is generally detectable in upper and lower  respiratory specimens dur ing the acute phase of infection.  Positive  results are indicative of active infection with SARS-CoV-2.  Clinical  correlation with patient history and other diagnostic information is  necessary to determine patient infection status.  Positive results do  not rule out bacterial  infection or co-infection with other viruses. If result is PRESUMPTIVE POSTIVE SARS-CoV-2 nucleic acids MAY BE PRESENT.   A presumptive positive result was obtained on the submitted specimen  and confirmed on repeat testing.  While 2019 novel coronavirus  (SARS-CoV-2) nucleic acids may be present in the submitted sample  additional confirmatory testing may be necessary for epidemiological  and / or clinical management purposes  to differentiate between  SARS-CoV-2 and other Sarbecovirus currently known to infect humans.  If clinically indicated additional testing with an alternate test  methodology [phone] is advised. The SARS-CoV-2 RNA is generally  detectable in upper and lower respiratory sp ecimens during the acute  phase of infection. The expected result is Negative. Fact Sheet for Patients:  BoilerBrush.com.cyhttps://www.fda.gov/media/136312/download Fact Sheet for Healthcare Providers: https://pope.com/https://www.fda.gov/media/136313/download This test is not yet approved or cleared by the [place] FDA and has been authorized for detection and/or diagnosis of SARS-CoV-2 by FDA under an Emergency Use Authorization (EUA).  This EUA will remain in effect (meaning this test can be used) for the duration of the COVID-19 declaration under Section 564(b)(1) of the Act, 21 U.S.C. section 360bbb-3(b)(1), unless the authorization is terminated or revoked sooner. Performed at [facility], [address] [place], [place] [address]   Blood culture (routine x 2)     Status: None   Collection Time: 09/09/18  6:53 AM   Specimen: BLOOD  Result Value Ref Range Status   Specimen Description   Final    BLOOD RIGHT ANTECUBITAL Performed at [facility], [address] [place], [place] [address]    Special Requests   Final    BOTTLES DRAWN AEROBIC AND ANAEROBIC Blood Culture adequate volume Performed at [facility], [address] [place], [place] [address]    Culture    Final    NO GROWTH 5 DAYS Performed at [facility], [address] [place], [place] [address]    Report Status 09/14/2018 FINAL  Final  MRSA PCR Screening     Status: Abnormal   Collection Time: 09/09/18  2:02 PM   Specimen: Nasal Mucosa; Nasopharyngeal  Result Value Ref Range Status   MRSA by PCR POSITIVE (A) NEGATIVE Final    Comment:        The GeneXpert MRSA Assay (FDA approved for NASAL specimens only), is one component of a comprehensive MRSA colonization surveillance program. It is not intended to diagnose MRSA infection nor to guide or monitor treatment for MRSA infections. RESULT CALLED TO, READ BACK BY AND VERIFIED WITH: PULEO,R. RN @1623  ON 07.20.2020 BY COHEN,K Performed at [facility], [address] [place], [place] [address]   Culture, respiratory (non-expectorated)     Status: None   Collection Time: 09/09/18  3:00 PM   Specimen: Tracheal Aspirate; Respiratory  Result Value Ref Range Status   Specimen Description   Final    TRACHEAL ASPIRATE Performed at [facility], [address] [place], [place] [address]    Special Requests   Final    NONE Performed at [facility], [address] [place], [place] [address]    Gram Stain   Final    ABUNDANT WBC PRESENT, PREDOMINANTLY PMN MODERATE YEAST FEW GRAM POSITIVE COCCI IN PAIRS FEW GRAM NEGATIVE RODS    Culture   Final    Consistent with normal respiratory flora. Performed at [facility], [address] [place], [place] [address]    Report Status 09/12/2018 FINAL  Final  Blood culture (routine x 2)     Status: None   Collection Time: 09/09/18  5:21 PM   Specimen: BLOOD RIGHT HAND  Result Value Ref Range Status   Specimen Description   Final    BLOOD RIGHT HAND Performed at [facility], [address] [place], [place] [address]    Special Requests   Final    BOTTLES DRAWN AEROBIC ONLY Blood Culture results may not be  optimal due to an inadequate volume of blood received in culture bottles Performed at [facility], [address] [place], [place] [address]    Culture  Final    NO GROWTH 5 DAYS Performed at [facility], [place] [address], [place], [place] [address]    Report Status 09/14/2018 FINAL  Final  C difficile quick scan w PCR reflex     Status: None   Collection Time: 09/11/18  4:15 AM   Specimen: STOOL  Result Value Ref Range Status   C Diff antigen NEGATIVE NEGATIVE Final   C Diff toxin NEGATIVE NEGATIVE Final   C Diff interpretation No C. difficile detected.  Final    Comment: Performed at [facility], [place] [address], [place], [place] [address]      Radiology Studies: No results found.    LOS: 5 days   Time spent: More than 50% of that time was spent in counseling and/or coordination of care.  Antonieta Pert, MD [facility]  09/14/2018, 2:03 PM

## 2018-09-15 LAB — CBC
HCT: 25.5 % — ABNORMAL LOW (ref 36.0–46.0)
Hemoglobin: 7.5 g/dL — ABNORMAL LOW (ref 12.0–15.0)
MCH: 29.1 pg (ref 26.0–34.0)
MCHC: 29.4 g/dL — ABNORMAL LOW (ref 30.0–36.0)
MCV: 98.8 fL (ref 80.0–100.0)
Platelets: 139 10*3/uL — ABNORMAL LOW (ref 150–400)
RBC: 2.58 MIL/uL — ABNORMAL LOW (ref 3.87–5.11)
RDW: 19.7 % — ABNORMAL HIGH (ref 11.5–15.5)
WBC: 8.2 10*3/uL (ref 4.0–10.5)
nRBC: 0 % (ref 0.0–0.2)

## 2018-09-15 LAB — BASIC METABOLIC PANEL
Anion gap: 7 (ref 5–15)
BUN: 12 mg/dL (ref 8–23)
CO2: 21 mmol/L — ABNORMAL LOW (ref 22–32)
Calcium: 8.4 mg/dL — ABNORMAL LOW (ref 8.9–10.3)
Chloride: 115 mmol/L — ABNORMAL HIGH (ref 98–111)
Creatinine, Ser: 0.51 mg/dL (ref 0.44–1.00)
GFR calc Af Amer: >60 mL/min (ref >60)
GFR calc non Af Amer: >60 mL/min (ref >60)
Glucose, Bld: 101 mg/dL — ABNORMAL HIGH (ref 70–99)
Potassium: 3.8 mmol/L (ref 3.5–5.1)
Sodium: 143 mmol/L (ref 135–145)

## 2018-09-15 LAB — GLUCOSE, CAPILLARY
Glucose-Capillary: 100 mg/dL — ABNORMAL HIGH (ref 70–99)
Glucose-Capillary: 107 mg/dL — ABNORMAL HIGH (ref 70–99)
Glucose-Capillary: 89 mg/dL (ref 70–99)
Glucose-Capillary: 92 mg/dL (ref 70–99)
Glucose-Capillary: 98 mg/dL (ref 70–99)

## 2018-09-15 NOTE — Progress Notes (Signed)
Palliative Care Progress Note  Called and spoke with Taylor Ruiz her cousin and one of the designated decision makers. I discussed the trajectory of her illness, option for her care moving forward and recommendations for hospice care. He is in favor of transitioning her to comfort care. He requests that I call and confirm this with Taylor Ruiz another relative. I have attempted to call Taylor Ruiz but no answer-I will try again later this AM. I recommend removing the feeding tube and doing comfort feeding. If her PO intake remains low she would be appropriate for a hospice facility. Currently she is resting peacefully and in no distress. Family well educated on choices prior to my discussion with them today and are prepared for the hospice care transition.  Time: 35 min Greater than 50%  of this time was spent counseling and coordinating care related to the above assessment and plan.   Lane Hacker, DO Palliative Medicine

## 2018-09-15 NOTE — Plan of Care (Signed)
Patient lying in bed this morning; responsive to voice but unable to understand most of what she is saying. Patient appears comfortable; does not appear to be in pain or discomfort. Will continue to monitor.

## 2018-09-15 NOTE — Progress Notes (Signed)
PROGRESS NOTE    Taylor Ruiz  VFI:[id number] DOB: Apr 11, 1935 DOA: 09/09/2018 PCP: Jilda Panda, MD   Brief Narrative: 83 year old female with history of dementia, history of aspiration pneumonia presents with hypoxia, increased work of breathing and altered mental status on 7/20, admitted intubated, volume resuscitated and antibiotics were started.  Initially on pressors- stopped 7/21, had diarrhea tested for C. difficile, 7/22 extubated, vancomycin was stopped changed her to DNR/DNI and transferred to hospital service 7/23. Continue on NGT feeding, overall more alert awake.  Subjective: Patient appears more alert and awake today.  Follows simple commands.  Remains on NG tube feeding.    Assessment & Plan:  Acute hypoxic respiratory failure with aspiration pneumonia: resolved. on room air.  Impression overall improving, leukocytosis has resolved. Cont Zosyn for pneumonia. Continue aspiration precaution, supportive care.  NG tube in place with tube feed. SLP eval once more awake for feeding trial. Recent Labs  Lab 09/11/18 1153 09/12/18 0427 09/13/18 0447 09/14/18 0454 09/15/18 0313  WBC 16.9* 12.9* 10.2 10.7* 8.2   Acute metabolic encephalopathy in the setting of dementia: Overall seems to have improved but with based on dementia, follows simple commands alert awake   Septic shock due to #1 on also with volume depletion needing pressors. BP stable, afebrile and wbc normal now. Continue antibiotics-Zosyn, throat culture:Gram stain.  Gram-positive cocci in pairs, few gram-negative rods consistent with normal respiratory flora.  Blood cultures 7/20 no growth so far.  AKI due to sepsis volume depletion:Resolved. Recent Labs  Lab 09/11/18 0420 09/12/18 0427 09/13/18 0447 09/14/18 0454 09/15/18 0313  BUN 31* 18 12 12 12   CREATININE 0.81  0.76 0.64 0.47 0.45 0.51   Hypokalemia/hypophosphatemia/hypomagnesemia: Electrolytes have improved.    DVT prophylaxis: Heparin Code  Status:DNR  Family Communication:palliative care consulted-family has been updated by palliative care and if patient further declines they agree to discontinue NG tube and focus on comfort. I tried niece Berenice Primas 7/23 but  no answer-spoke to her and updated 7/24.She request Cousin Winford to be primary contact.  Discussed with palliative care today.  Cousin was contacted at this time agreed for hospice per palliative however confirmed with rest of the relatives. Given her overall poor baseline condition dementia anticipating hospice care will be appropriate.  Noted hospice note that patient was recently enrolled in palliative care program was scheduled to have first visit but was admitted to the hospital prior to that.  Disposition Plan: Remains inpatient pending clinical improvement.  Consultants:   PCCM Palliative care  Procedures:  ETT-off 7/22  Microbiology: COVID 19 neg  Throat culture 7/20:Gram stain.  Gram-positive cocci in pairs, few gram-negative rods consistent with normal respiratory flora.  Blood cultures 7/20 no growth so far.  Antimicrobials: Anti-infectives (From admission, onward)   Start     Dose/Rate Route Frequency Ordered Stop   09/10/18 1000  vancomycin (VANCOCIN) IVPB 1000 mg/200 mL premix  Status:  Discontinued     1,000 mg 200 mL/hr over 60 Minutes Intravenous Every 36 hours 09/09/18 1433 09/11/18 1044   09/09/18 2200  piperacillin-tazobactam (ZOSYN) IVPB 3.375 g     3.375 g 12.5 mL/hr over 240 Minutes Intravenous Every 8 hours 09/09/18 1213     09/09/18 1600  piperacillin-tazobactam (ZOSYN) IVPB 3.375 g     3.375 g 100 mL/hr over 30 Minutes Intravenous  Once 09/09/18 1213 09/09/18 1650   09/09/18 0730  vancomycin (VANCOCIN) IVPB 1000 mg/200 mL premix     1,000 mg 200 mL/hr over 60 Minutes Intravenous  Once 09/09/18 0719 09/09/18 1230   09/09/18 0730  ceFEPIme (MAXIPIME) 2 g in sodium chloride 0.9 % 100 mL IVPB     2 g 200 mL/hr over 30 Minutes  Intravenous  Once 09/09/18 0719 09/09/18 0850   09/09/18 0730  metroNIDAZOLE (FLAGYL) IVPB 500 mg     500 mg 100 mL/hr over 60 Minutes Intravenous  Once 09/09/18 0719 09/09/18 1006       Objective: Vitals:   09/14/18 0511 09/14/18 1349 09/14/18 2041 09/15/18 0513  BP: 136/71 130/67 (!) 119/49 127/63  Pulse: (!) 103 98 98 (!) 101  Resp: 20 18 20 18   Temp: 99 F (37.2 C) 98.7 F (37.1 C) 98.5 F (36.9 C) 98.6 F (37 C)  TempSrc: Oral Oral Oral Oral  SpO2: 100% 100% 96% 98%  Weight: 70.9 kg     Height:        Intake/Output Summary (Last 24 hours) at 09/15/2018 1147 Last data filed at 09/15/2018 0944 Gross per 24 hour  Intake 1532.68 ml  Output 3700 ml  Net -2167.32 ml   Filed Weights   09/12/18 0500 09/13/18 0413 09/14/18 0511  Weight: 67 kg 69.5 kg 70.9 kg   Weight change:   Body mass index is 23.77 kg/m.  Intake/Output from previous day: 07/25 0701 - 07/26 0700 In: 2166.9 [I.V.:932.5; NG/GT:990; IV Piggyback:244.4] Out: 3450 [Urine:3150; Stool:300] Intake/Output this shift: Total I/O In: -  Out: 650 [Urine:350; Stool:300]  Examination:  General exam: Alert awake, sees "doing so-so" acute events overnight.  Remains on NG tube feeding.   HEENT:PERRL,Oral mucosa moist, Ear/Nose normal on gross exam Respiratory system: Bilateral equal air entry, normal vesicular breath sounds, no wheezes or crackles  Cardiovascular system: S1 & S2 heard,No JVD, murmurs. Gastrointestinal system: Abdomen is  soft, non tender, non distended, BS +  Nervous System: Opens eyes, able to repeat words, Extremities: trace leg edema, distal pulses+ Skin: No rashes, lesions, no icterus MSK: Normal muscle bulk,tone ,power  Medications:  Scheduled Meds: . Chlorhexidine Gluconate Cloth  6 each Topical Daily  . free water  200 mL Per Tube Q4H  . heparin  5,000 Units Subcutaneous Q8H  . mouth rinse  15 mL Mouth Rinse BID  . sodium chloride flush  10-40 mL Intracatheter Q12H   Continuous  Infusions: . famotidine (PEPCID) IV 20 mg (09/15/18 1114)  . feeding supplement (VITAL AF 1.2 CAL) 55 mL/hr at 09/15/18 0600  . lactated ringers 50 mL/hr at 09/15/18 1111  . piperacillin-tazobactam (ZOSYN)  IV 3.375 g (09/15/18 0501)    Data Reviewed: I have personally reviewed following labs and imaging studies  CBC: Recent Labs  Lab 09/09/18 0704 09/11/18 1153 09/12/18 0427 09/13/18 0447 09/14/18 0454 09/15/18 0313  WBC 17.7* 16.9* 12.9* 10.2 10.7* 8.2  NEUTROABS 14.7*  --   --   --   --   --   HGB 14.7 8.9* 8.3* 8.3* 8.5* 7.5*  HCT 50.2* 29.8* 27.3* 26.7* 27.2* 25.5*  MCV 102.9* 100.0 98.9 96.7 97.1 98.8  PLT 208 PLATELET CLUMPS NOTED ON SMEAR, UNABLE TO ESTIMATE 98* 106* 124* 139*   Basic Metabolic Panel: Recent Labs  Lab 09/10/18 0822  09/11/18 0420 09/12/18 0427 09/13/18 0447 09/14/18 0454 09/15/18 0313  NA 153*   < > 147* 141 141 142 143  K 3.5   < > 3.0* 2.9* 3.3* 3.9 3.8  CL 126*   < > 122* 115* 114* 116* 115*  CO2 18*   < > 17* 18* 19*  21* 21*  GLUCOSE 141*   < > 111* 98 97 100* 101*  BUN 49*   < > 31* 18 12 12 12   CREATININE 1.26*   < > 0.81  0.76 0.64 0.47 0.45 0.51  CALCIUM 8.7*   < > 8.0* 7.7* 7.7* 8.3* 8.4*  MG 1.9  --  1.5* 1.7 1.4* 1.9  --   PHOS <1.0*  --  1.9* 1.8* 1.7* 2.3*  --    < > = values in this interval not displayed.   GFR: Estimated Creatinine Clearance: 55.6 mL/min (by C-G formula based on SCr of 0.51 mg/dL). Liver Function Tests: Recent Labs  Lab 09/09/18 0704  AST 17  ALT 10  ALKPHOS 52  BILITOT 0.6  PROT 8.7*  ALBUMIN 3.6   No results for input(s): LIPASE, AMYLASE in the last 168 hours. No results for input(s): AMMONIA in the last 168 hours. Coagulation Profile: No results for input(s): INR, PROTIME in the last 168 hours. Cardiac Enzymes: No results for input(s): CKTOTAL, CKMB, CKMBINDEX, TROPONINI in the last 168 hours. BNP (last 3 results) No results for input(s): PROBNP in the last 8760 hours. HbA1C: No results  for input(s): HGBA1C in the last 72 hours. CBG: Recent Labs  Lab 09/14/18 0508 09/14/18 0710 09/14/18 1647 09/14/18 2037 09/15/18 0730  GLUCAP 93 87 102* 93 98   Lipid Profile: No results for input(s): CHOL, HDL, LDLCALC, TRIG, CHOLHDL, LDLDIRECT in the last 72 hours. Thyroid Function Tests: No results for input(s): TSH, T4TOTAL, FREET4, T3FREE, THYROIDAB in the last 72 hours. Anemia Panel: No results for input(s): VITAMINB12, FOLATE, FERRITIN, TIBC, IRON, RETICCTPCT in the last 72 hours. Sepsis Labs: Recent Labs  Lab 09/09/18 1403 09/09/18 1724 09/10/18 0822 09/11/18 0420  PROCALCITON 1.61  --  2.00 1.25  LATICACIDVEN  --  4.4* 3.3*  --     Recent Results (from the past 240 hour(s))  SARS Coronavirus 2 (CEPHEID - Performed in [facility] hospital lab), Hosp Order     Status: None   Collection Time: 09/09/18  6:48 AM   Specimen: Nasopharyngeal Swab  Result Value Ref Range Status   SARS Coronavirus 2 NEGATIVE NEGATIVE Final    Comment: (NOTE) If result is NEGATIVE SARS-CoV-2 target nucleic acids are NOT DETECTED. The SARS-CoV-2 RNA is generally detectable in upper and lower  respiratory specimens during the acute phase of infection. The lowest  concentration of SARS-CoV-2 viral copies this assay can detect is 250  copies / mL. A negative result does not preclude SARS-CoV-2 infection  and should not be used as the sole basis for treatment or other  patient management decisions.  A negative result may occur with  improper specimen collection / handling, submission of specimen other  than nasopharyngeal swab, presence of viral mutation(s) within the  areas targeted by this assay, and inadequate number of viral copies  (<250 copies / mL). A negative result must be combined with clinical  observations, patient history, and epidemiological information. If result is POSITIVE SARS-CoV-2 target nucleic acids are DETECTED. The SARS-CoV-2 RNA is generally detectable in upper and  lower  respiratory specimens dur ing the acute phase of infection.  Positive  results are indicative of active infection with SARS-CoV-2.  Clinical  correlation with patient history and other diagnostic information is  necessary to determine patient infection status.  Positive results do  not rule out bacterial infection or co-infection with other viruses. If result is PRESUMPTIVE POSTIVE SARS-CoV-2 nucleic acids MAY BE PRESENT.  A presumptive positive result was obtained on the submitted specimen  and confirmed on repeat testing.  While 2019 novel coronavirus  (SARS-CoV-2) nucleic acids may be present in the submitted sample  additional confirmatory testing may be necessary for epidemiological  and / or clinical management purposes  to differentiate between  SARS-CoV-2 and other Sarbecovirus currently known to infect humans.  If clinically indicated additional testing with an alternate test  methodology [phone]) is advised. The SARS-CoV-2 RNA is generally  detectable in upper and lower respiratory sp ecimens during the acute  phase of infection. The expected result is Negative. Fact Sheet for Patients:  BoilerBrush.com.cy Fact Sheet for Healthcare Providers: https://pope.com/ This test is not yet approved or cleared by the [place] FDA and has been authorized for detection and/or diagnosis of SARS-CoV-2 by FDA under an Emergency Use Authorization (EUA).  This EUA will remain in effect (meaning this test can be used) for the duration of the COVID-19 declaration under Section 564(b)(1) of the Act, 21 U.S.C. section 360bbb-3(b)(1), unless the authorization is terminated or revoked sooner. Performed at [facility], [address], [place], [place] [address]   Blood culture (routine x 2)     Status: None   Collection Time: 09/09/18  6:53 AM   Specimen: BLOOD  Result Value Ref Range Status   Specimen  Description   Final    BLOOD RIGHT ANTECUBITAL Performed at [facility], [address] [place], [place] [address]    Special Requests   Final    BOTTLES DRAWN AEROBIC AND ANAEROBIC Blood Culture adequate volume Performed at [facility], [address], [place], [place] [address]    Culture   Final    NO GROWTH 5 DAYS Performed at [facility], [address], [place], [place] [address]    Report Status 09/14/2018 FINAL  Final  MRSA PCR Screening     Status: Abnormal   Collection Time: 09/09/18  2:02 PM   Specimen: Nasal Mucosa; Nasopharyngeal  Result Value Ref Range Status   MRSA by PCR POSITIVE (A) NEGATIVE Final    Comment:        The GeneXpert MRSA Assay (FDA approved for NASAL specimens only), is one component of a comprehensive MRSA colonization surveillance program. It is not intended to diagnose MRSA infection nor to guide or monitor treatment for MRSA infections. RESULT CALLED TO, READ BACK BY AND VERIFIED WITH: PULEO,R. RN  ON 07.20.2020 BY COHEN,K Performed at [facility], [address] [place], [place] [address]   Culture, respiratory (non-expectorated)     Status: None   Collection Time: 09/09/18  3:00 PM   Specimen: Tracheal Aspirate; Respiratory  Result Value Ref Range Status   Specimen Description   Final    TRACHEAL ASPIRATE Performed at [facility], [address] [place], [place] [address]    Special Requests   Final    NONE Performed at [facility], [address], [place], [place] [address]    Gram Stain   Final    ABUNDANT WBC PRESENT, PREDOMINANTLY PMN MODERATE YEAST FEW GRAM POSITIVE COCCI IN PAIRS FEW GRAM NEGATIVE RODS    Culture   Final    Consistent with normal respiratory flora. Performed at [facility], [address] [place], [place] [address]    Report Status 09/12/2018 FINAL  Final  Blood culture (routine x 2)     Status:  None  Collection Time: 09/09/18  5:21 PM   Specimen: BLOOD RIGHT HAND  Result Value Ref Range Status   Specimen Description   Final    BLOOD RIGHT HAND Performed at [facility], [address] [place], [place] [address]    Special Requests   Final    BOTTLES DRAWN AEROBIC ONLY Blood Culture results may not be optimal due to an inadequate volume of blood received in culture bottles Performed at [facility], [address] [place], [place] [address]    Culture   Final    NO GROWTH 5 DAYS Performed at [facility], [address] [place], [place] [address]    Report Status 09/14/2018 FINAL  Final  C difficile quick scan w PCR reflex     Status: None   Collection Time: 09/11/18  4:15 AM   Specimen: STOOL  Result Value Ref Range Status   C Diff antigen NEGATIVE NEGATIVE Final   C Diff toxin NEGATIVE NEGATIVE Final   C Diff interpretation No C. difficile detected.  Final    Comment: Performed at [facility], [address] [place], [place] [address]      Radiology Studies: No results found.    LOS: 6 days   Time spent: More than 50% of that time was spent in counseling and/or coordination of care.  Lanae Boastamesh Marielis Samara, MD [facility]  09/15/2018, 11:47 AM

## 2018-09-15 NOTE — Evaluation (Signed)
Clinical/Bedside Swallow Evaluation Patient Details  Name: Taylor Ruiz MRN: [id number] Date of Birth: 1935/09/09  Today's Date: 09/15/2018 Time: SLP Start Time (ACUTE ONLY): 6269 SLP Stop Time (ACUTE ONLY): 1430 SLP Time Calculation (min) (ACUTE ONLY): 15 min  Past Medical History:  Past Medical History:  Diagnosis Date  . Dementia ([place])   . HTN (hypertension)   . Vertigo    Past Surgical History: History reviewed. No pertinent surgical history. HPI:  83 year old woman with hypertension, dementia, history of CVA with associated dysphagia, chronic renal insufficiency.  She resides in skilled nursing facility, was found to have acute onset of encephalopathy, hypoxemic respiratory failure.  Of note recent hospitalization June 2-7 for HCAP and metabolic encephalopathy.  She required intubation mechanical ventilation in the emergency department.  Her chest x-ray showed stable right mid and lower lobe infiltrates.  Labs significant for severe hypernatremia, evolving acute renal insufficiency, leukocytosis.  Her COVID testing was negative.   Assessment / Plan / Recommendation Clinical Impression  Clinical swallowing evaluation was completed using thin liquids via spoon sips only.  Patient is known to [name] from previous admission with clinical swallowing evaluation completed 07/24/2018 with recommendation for full liquids with findings of a probable oral dysphagia secondary to her mentation.  Patient is now s/p a 2 day intubation.  She is confused and does not open her eyes throughout much of evaluation this date.  She was unable to follow commands.  Oral care was completed using swabs but the patient would never open her mouth for visual inspection of her oral cavity.  Cranial nerve exam was attempted and she was unable to follow commands.  The patient was essentially nonverbal with unintelligible vocalizations heard.  The patient was presenting with a cognitive based dysphagia with oral  holding of material for very extended periods of time.  Given that she was only given spoon sips of thin and exam was ended.  Suggest that she remain on tube feeds until full decision regarding goals of care are decided.  ST will follow pending decisions regarding goals of care.   SLP Visit Diagnosis: Dysphagia, oral phase (R13.11)    Aspiration Risk  Moderate aspiration risk    Diet Recommendation   NPO pending decisions regarding goals of care  Medication Administration: Via alternative means    Other  Recommendations Oral Care Recommendations: Oral care QID   Follow up Recommendations Other (comment)(TBD)      Frequency and Duration min 2x/week  2 weeks       Prognosis Prognosis for Safe Diet Advancement: Fair Barriers to Reach Goals: Cognitive deficits      Swallow Study   General Date of Onset: 09/09/18 HPI: 83 year old woman with hypertension, dementia, history of CVA with associated dysphagia, chronic renal insufficiency.  She resides in skilled nursing facility, was found to have acute onset of encephalopathy, hypoxemic respiratory failure.  Of note recent hospitalization June 2-7 for HCAP and metabolic encephalopathy.  She required intubation mechanical ventilation in the emergency department.  Her chest x-ray showed stable right mid and lower lobe infiltrates.  Labs significant for severe hypernatremia, evolving acute renal insufficiency, leukocytosis.  Her COVID testing was negative. Type of Study: Bedside Swallow Evaluation Previous Swallow Assessment: CSE6/04/2018 felt to have primary oral dysphagia due to mentation.   Diet Prior to this Study: NG Tube Temperature Spikes Noted: Yes History of Recent Intubation: Yes Length of Intubations (days): 2 days Date extubated: 09/11/18 Behavior/Cognition: Confused;Doesn't follow directions Oral Cavity Assessment: Other (comment)(unable to  assess) Oral Care Completed by SLP: Yes Oral Cavity - Dentition: Edentulous Self-Feeding  Abilities: Total assist Patient Positioning: Upright in bed Baseline Vocal Quality: Not observed Volitional Swallow: Unable to elicit    Oral/Motor/Sensory Function Overall Oral Motor/Sensory Function: Other (comment)(Unable to assess)   Ice Chips Ice chips: Not tested   Thin Liquid Thin Liquid: Impaired Presentation: Spoon Oral Phase Impairments: Poor awareness of bolus    Nectar Thick Nectar Thick Liquid: Not tested   Honey Thick Honey Thick Liquid: Not tested   Puree Puree: Not tested   Solid     Solid: Not tested     Taylor AguasMelissa Lavontae Cornia, MA, CCC-SLP Acute Rehab SLP [phone]  Taylor ContrasMelissa N Taylor Ruiz 09/15/2018,2:42 PM

## 2018-09-16 LAB — GLUCOSE, CAPILLARY
Glucose-Capillary: 94 mg/dL (ref 70–99)
Glucose-Capillary: 81 mg/dL (ref 70–99)

## 2018-09-16 LAB — SARS CORONAVIRUS 2 BY RT PCR (HOSPITAL ORDER, PERFORMED IN ~~LOC~~ HOSPITAL LAB): SARS Coronavirus 2: NEGATIVE

## 2018-09-16 NOTE — TOC Progression Note (Addendum)
Transition of Care [facility]) - Progression Note    Patient Details  Name: Taylor Ruiz MRN: [id number] Date of Birth: Aug 20, 1935  Transition of [facility] CM/SW Contact  Leeroy Cha, RN Phone Number: 09/16/2018, 12:15 PM  Clinical Narrative:    tct- winford bethea to see which residental hosice the family would like to use. TCF_[place]/will get with family member and decide on a facility. TCF-family saying that [facility] has a residential Radio broadcast assistant at [place] are not a residential hospice she will call the family and explain this. [id number]/tct-[facility] and palliative care to check bed avilablity-Anne Lelan Pons wcb.  tcf-[facility]-beds are available. tct-elevlyn Tamala Julian informed that [facility] is not a hospice center, understands this name of [place] [address]-tcf-[place] Tamala Julian and son-okay to go to [employer] referral for [facility] given. Expected Discharge Plan: Skilled Nursing Facility Barriers to Discharge: Continued Medical Work up  Expected Discharge Plan and Services Expected Discharge Plan: [place]   Discharge Planning Services: CM Consult   Living arrangements for the past 2 months: [place] Expected Discharge Date: 09/16/18                                     Social Determinants of Health (SDOH) Interventions    Readmission Risk Interventions Readmission Risk Prevention Plan 09/10/2018  Transportation Screening Complete  PCP or Specialist Appt within 3-5 Days Not Complete  Not Complete comments Not close to discharge  [place] or [place] Not Complete  HRI or Home Care Consult comments From SNF  Social Work Consult for [place] Planning/Counseling Not Complete  SW consult not completed comments NA  Palliative Care Screening Complete  Medication Review Press photographer) Complete

## 2018-09-16 NOTE — Progress Notes (Addendum)
Palliative Care Progress Note Spoke with Winford Dollens again re: goals of care, as I have not been able to get in touch with Taylor Ruiz. Winford has given me permission to proceed with removing the NG feeding and transitioning to comfort care. Given the degree of her swallowing issues I anticipate she has < 2 weeks and will have increasing symptom management needs. He has agreed to a referral to a hospice facility. I confirmed plan and answered all of his questions. He would like to see the patient tomorrow and this is within the hospital guidelines of visitation in a cognitively impaired patient who is likely facing end of life.  1. Discontinue NG feeding 2. Comfort Feeding, careful aspiration precaution 3. Oral Care 4. DC meds not related to comfort 5. Referral to [place], [place] [phone]  Time: 35 min Greater than 50%  of this time was spent counseling and coordinating care related to the above assessment and plan.

## 2018-09-16 NOTE — Discharge Summary (Signed)
Physician Discharge Summary  Taylor Ruiz ZOX:[id number] DOB: 01/20/1936 DOA: 09/09/2018  PCP: Ralene OkMoreira, Roy, MD  Admit date: 09/09/2018 Discharge date: 09/16/2018  Admitted From: ALF Dispositio: HOSPICE  Recommendations for Outpatient Follow-up:  Patient is being discharged with hospice care.  Home Health: hospise  Equipment/Devices:hospice  Discharge Condition: Stable CODE STATUS: DNR Diet recommendation: Pleasure feeding  Brief/Interim Summary: 83 year old female with history of dementia, history of aspiration pneumonia presents with hypoxia, increased work of breathing and altered mental status on 7/20, admitted intubated, volume resuscitated and antibiotics were started.  Initially on pressors- stopped 7/21, had diarrhea tested for C. difficile, 7/22 extubated, vancomycin was stopped changed her to DNR/DNI and transferred to hospital service 7/23. Patient was going nue on ngt feeding on , overall more alert awake. Was followed by palliative care.  Patient still unable to take orally, discussed with patient's family and palliative care at this time has transition patient to comfort measures.  Patient will be discharged with hospice and  Her COVID 19 is negative 7/27  Discharge Diagnoses:  Active Problems:   Aspiration pneumonia of right lung (HCC)   FTT (failure to thrive) in adult   Hypernatremia   Obtundation  Acute hypoxic respiratory failure with aspiration pneumonia: resolved. on room air. FEN:NG tube discontinued and transition to hospice care.   Acute metabolic encephalopathy in the setting of dementia: Overall more alert awake, with baseline dementia,  Septic shock due to #1 on also with volume depletion needing pressors. BP stable, afebrile and wbc normal now.  Stopping antibiotics as patient is transferred to comfort. Finished abx AKI due to sepsis volume depletion:Resolved Hypokalemia/hypophosphatemia/hypomagnesemia: Electrolytes have improved.    Code  Status:DNR  Family Communication:palliative care consulted-family has been updated by palliative care.today palliative care spke with all family-they agreed to discontinue NG tube and focus on comfort.  Transitioned to comfort measures with plan for discharge to hospice.   Disposition Plan: d/c to hospice facility   Discharge Instructions  Discharge Instructions    Diet general   Complete by: As directed    Pleasure feeding for comfort   Increase activity slowly   Complete by: As directed      Allergies as of 09/16/2018   No Known Allergies     Medication List    STOP taking these medications   acetaminophen 325 MG tablet Commonly known as: TYLENOL   amLODipine 10 MG tablet Commonly known as: NORVASC   donepezil 5 MG tablet Commonly known as: ARICEPT   feeding supplement (PRO-STAT SUGAR FREE 64) Liqd   meclizine 25 MG tablet Commonly known as: ANTIVERT   metoprolol tartrate 25 MG tablet Commonly known as: LOPRESSOR   pregabalin 50 MG capsule Commonly known as: LYRICA   Rexulti 0.5 MG Tabs Generic drug: Brexpiprazole   valACYclovir 500 MG tablet Commonly known as: VALTREX     TAKE these medications   OXYGEN Inhale 2 L into the lungs continuous.       No Known Allergies  Procedures/Studies: Dg Abd 1 View  Result Date: 09/09/2018 CLINICAL DATA:  NG tube placement. EXAM: ABDOMEN - 1 VIEW COMPARISON:  None. FINDINGS: The NG tube tip is in the fundal region of the stomach. The proximal port is in the distal esophagus. This could be advanced several cm. The bowel gas pattern is unremarkable. No definite findings for obstruction or perforation. The soft tissue shadows are grossly maintained. Surgical changes noted in the right upper quadrant. IMPRESSION: 1. The NG tube tip is in the  fundal region the stomach and the proximal port is in the distal esophagus. This could be advanced several cm. 2. No definite findings for obstruction or perforation. Electronically  Signed   By: Rudie Meyer M.D.   On: 09/09/2018 14:48   Dg Chest Port 1 View  Result Date: 09/12/2018 CLINICAL DATA:  Patient admitted 09/09/2018 with encephalopathy and hypoxic respiratory failure. EXAM: PORTABLE CHEST 1 VIEW COMPARISON:  Single-view of the chest 09/09/2018, 07/24/2018. FINDINGS: Endotracheal tube is no longer visualized. Feeding tube and right PICC are unchanged. Right basilar airspace disease persists without notable change. Left lung is clear. Heart size is normal. IMPRESSION: Endotracheal tube is no longer seen. New feeding tube courses into the stomach and below the inferior margin the film. No change in right basilar airspace disease. Electronically Signed   By: Drusilla Kanner M.D.   On: 09/12/2018 07:50   Dg Chest Portable 1 View  Result Date: 09/09/2018 CLINICAL DATA:  ET tube placement EXAM: PORTABLE CHEST 1 VIEW COMPARISON:  09/09/2018 FINDINGS: Endotracheal tube is 4.6 cm above the carina. Heart is normal size. Airspace disease in the right mid and lower lung similar to prior study. Left lung clear. No effusions or acute bony abnormality. IMPRESSION: Endotracheal tube in expected position. Persistent right mid and lower lung infiltrate.  No real change. Electronically Signed   By: Charlett Nose M.D.   On: 09/09/2018 09:08   Dg Chest Portable 1 View  Result Date: 09/09/2018 CLINICAL DATA:  Respiratory distress. EXAM: PORTABLE CHEST 1 VIEW COMPARISON:  07/24/2018. FINDINGS: Mediastinum is normal. Persistent right mid and lower lung infiltrates, similar findings noted on prior exam. No pleural effusion or pneumothorax. Heart size normal. Thoracic spine scoliosis. IMPRESSION: Persistent right mid and lower lung infiltrates, similar findings noted on prior exam. Electronically Signed   By: Maisie Fus  Register   On: 09/09/2018 07:56   Dg Abd Portable 1v  Result Date: 09/11/2018 CLINICAL DATA:  Check feeding catheter placement EXAM: PORTABLE ABDOMEN - 1 VIEW COMPARISON:   09/09/2018 FINDINGS: Previously seen gastric catheter has been removed and a weighted feeding catheter has now been placed and lies in the mid stomach. The remainder of the exam is stable. IMPRESSION: Weighted feeding catheter now lies in the mid stomach. Electronically Signed   By: Alcide Clever M.D.   On: 09/11/2018 19:45   [place] Ekg Site Rite  Result Date: 09/09/2018 If Site Rite image not attached, placement could not be confirmed due to current cardiac rhythm.   Subjective: Alert awake demented.  Discharge Exam: Vitals:   09/16/18 0554 09/16/18 0556  BP: (!) 130/117 128/74  Pulse: 100 (!) 101  Resp: 15 16  Temp: 98.3 F (36.8 C)   SpO2: 100% 100%   Vitals:   09/15/18 1254 09/15/18 2045 09/16/18 0554 09/16/18 0556  BP: 122/64 109/70 (!) 130/117 128/74  Pulse: 99 97 100 (!) 101  Resp: Temp: 98.4 F (36.9 C) 97.7 F (36.5 C) 98.3 F (36.8 C)   TempSrc: Oral Oral Oral   SpO2: 99% 97% 100% 100%  Weight:    72.5 kg  Height:        General: Pt is alert, awake, not following much commands, will respond with voice.  Not in acute distress Cardiovascular: RRR, S1/S2 +, no rubs, no gallops Respiratory: CTA bilaterally, no wheezing, no rhonchi Abdominal: Soft, NT, ND, bowel sounds + Extremities: no edema, no cyanosis   The results of significant diagnostics from this hospitalization (including  imaging, microbiology, ancillary and laboratory) are listed below for reference.     Microbiology: Recent Results (from the past 240 hour(s))  SARS Coronavirus 2 (CEPHEID - Performed in [facility] hospital lab), Hosp Order     Status: None   Collection Time: 09/09/18  6:48 AM   Specimen: Nasopharyngeal Swab  Result Value Ref Range Status   SARS Coronavirus 2 NEGATIVE NEGATIVE Final    Comment: (NOTE) If result is NEGATIVE SARS-CoV-2 target nucleic acids are NOT DETECTED. The SARS-CoV-2 RNA is generally detectable in upper and lower  respiratory specimens during the  acute phase of infection. The lowest  concentration of SARS-CoV-2 viral copies this assay can detect is 250  copies / mL. A negative result does not preclude SARS-CoV-2 infection  and should not be used as the sole basis for treatment or other  patient management decisions.  A negative result may occur with  improper specimen collection / handling, submission of specimen other  than nasopharyngeal swab, presence of viral mutation(s) within the  areas targeted by this assay, and inadequate number of viral copies  (<250 copies / mL). A negative result must be combined with clinical  observations, patient history, and epidemiological information. If result is POSITIVE SARS-CoV-2 target nucleic acids are DETECTED. The SARS-CoV-2 RNA is generally detectable in upper and lower  respiratory specimens dur ing the acute phase of infection.  Positive  results are indicative of active infection with SARS-CoV-2.  Clinical  correlation with patient history and other diagnostic information is  necessary to determine patient infection status.  Positive results do  not rule out bacterial infection or co-infection with other viruses. If result is PRESUMPTIVE POSTIVE SARS-CoV-2 nucleic acids MAY BE PRESENT.   A presumptive positive result was obtained on the submitted specimen  and confirmed on repeat testing.  While 2019 novel coronavirus  (SARS-CoV-2) nucleic acids may be present in the submitted sample  additional confirmatory testing may be necessary for epidemiological  and / or clinical management purposes  to differentiate between  SARS-CoV-2 and other Sarbecovirus currently known to infect humans.  If clinically indicated additional testing with an alternate test  methodology [phone]) is advised. The SARS-CoV-2 RNA is generally  detectable in upper and lower respiratory sp ecimens during the acute  phase of infection. The expected result is Negative. Fact Sheet for Patients:   BoilerBrush.com.cyhttps://www.fda.gov/media/136312/download Fact Sheet for Healthcare Providers: https://pope.com/https://www.fda.gov/media/136313/download This test is not yet approved or cleared by the [place] FDA and has been authorized for detection and/or diagnosis of SARS-CoV-2 by FDA under an Emergency Use Authorization (EUA).  This EUA will remain in effect (meaning this test can be used) for the duration of the COVID-19 declaration under Section 564(b)(1) of the Act, 21 U.S.C. section 360bbb-3(b)(1), unless the authorization is terminated or revoked sooner. Performed at [facility], [address] [place], [place] [address]   Blood culture (routine x 2)     Status: None   Collection Time: 09/09/18  6:53 AM   Specimen: BLOOD  Result Value Ref Range Status   Specimen Description   Final    BLOOD RIGHT ANTECUBITAL Performed at [facility], [address] [place], [place] [address]    Special Requests   Final    BOTTLES DRAWN AEROBIC AND ANAEROBIC Blood Culture adequate volume Performed at [facility], [address] [place], [place] [address]    Culture   Final    NO GROWTH 5 DAYS Performed at  [facility], [place] [address] [place], [place] [address]    Report Status 09/14/2018 FINAL  Final  MRSA PCR Screening     Status: Abnormal   Collection Time: 09/09/18  2:02 PM   Specimen: Nasal Mucosa; Nasopharyngeal  Result Value Ref Range Status   MRSA by PCR POSITIVE (A) NEGATIVE Final    Comment:        The GeneXpert MRSA Assay (FDA approved for NASAL specimens only), is one component of a comprehensive MRSA colonization surveillance program. It is not intended to diagnose MRSA infection nor to guide or monitor treatment for MRSA infections. RESULT CALLED TO, READ BACK BY AND VERIFIED WITH: PULEO,R. RN @1623  ON 07.20.2020 BY COHEN,K Performed at [facility], [place] [address] [place], [place] [address]   Culture, respiratory  (non-expectorated)     Status: None   Collection Time: 09/09/18  3:00 PM   Specimen: Tracheal Aspirate; Respiratory  Result Value Ref Range Status   Specimen Description   Final    TRACHEAL ASPIRATE Performed at [place] [address] [place], [place] [address]    Special Requests   Final    NONE Performed at [facility], [place] [address], [place], [place] [address]    Gram Stain   Final    ABUNDANT WBC PRESENT, PREDOMINANTLY PMN MODERATE YEAST FEW GRAM POSITIVE COCCI IN PAIRS FEW GRAM NEGATIVE RODS    Culture   Final    Consistent with normal respiratory flora. Performed at [facility], [place] [address] [place], [place] [address]    Report Status 09/12/2018 FINAL  Final  Blood culture (routine x 2)     Status: None   Collection Time: 09/09/18  5:21 PM   Specimen: BLOOD RIGHT HAND  Result Value Ref Range Status   Specimen Description   Final    BLOOD RIGHT HAND Performed at [facility], [place] [address], [place], [place] [address]    Special Requests   Final    BOTTLES DRAWN AEROBIC ONLY Blood Culture results may not be optimal due to an inadequate volume of blood received in culture bottles Performed at [place] [address], [place], [place] [address]    Culture   Final    NO GROWTH 5 DAYS Performed at [facility], [place] [address], [place], [place] [address]    Report Status 09/14/2018 FINAL  Final  C difficile quick scan w PCR reflex     Status: None   Collection Time: 09/11/18  4:15 AM   Specimen: STOOL  Result Value Ref Range Status   C Diff antigen NEGATIVE NEGATIVE Final   C Diff toxin NEGATIVE NEGATIVE Final   C Diff interpretation No C. difficile detected.  Final    Comment: Performed at [facility], [place] [address], [place], [place] [address]     Labs: BNP (last 3 results) Recent Labs    09/09/18 0704  BNP 24.4   Basic Metabolic Panel: Recent  Labs  Lab 09/10/18 0822  09/11/18 0420 09/12/18 0427 09/13/18 0447 09/14/18 0454 09/15/18 0313  NA 153*   < > 147* 141 141 142 143  K 3.5   < > 3.0* 2.9* 3.3* 3.9 3.8  CL 126*   < > 122* 115* 114* 116* 115*  CO2 18*   < > 17* 18* 19* 21* 21*  GLUCOSE 141*   < > 111* 98 97 100*  101*  BUN 49*   < > 31* 18 12 12 12   CREATININE 1.26*   < > 0.81  0.76 0.64 0.47 0.45 0.51  CALCIUM 8.7*   < > 8.0* 7.7* 7.7* 8.3* 8.4*  MG 1.9  --  1.5* 1.7 1.4* 1.9  --   PHOS <1.0*  --  1.9* 1.8* 1.7* 2.3*  --    < > = values in this interval not displayed.   Liver Function Tests: No results for input(s): AST, ALT, ALKPHOS, BILITOT, PROT, ALBUMIN in the last 168 hours. No results for input(s): LIPASE, AMYLASE in the last 168 hours. No results for input(s): AMMONIA in the last 168 hours. CBC: Recent Labs  Lab 09/11/18 1153 09/12/18 0427 09/13/18 0447 09/14/18 0454 09/15/18 0313  WBC 16.9* 12.9* 10.2 10.7* 8.2  HGB 8.9* 8.3* 8.3* 8.5* 7.5*  HCT 29.8* 27.3* 26.7* 27.2* 25.5*  MCV 100.0 98.9 96.7 97.1 98.8  PLT PLATELET CLUMPS NOTED ON SMEAR, UNABLE TO ESTIMATE 98* 106* 124* 139*   Cardiac Enzymes: No results for input(s): CKTOTAL, CKMB, CKMBINDEX, TROPONINI in the last 168 hours. BNP: Invalid input(s): POCBNP CBG: Recent Labs  Lab 09/15/18 1628 09/15/18 2005 09/15/18 2353 09/16/18 0735 09/16/18 1125  GLUCAP 107* 100* 89 94 81   D-Dimer No results for input(s): DDIMER in the last 72 hours. Hgb A1c No results for input(s): HGBA1C in the last 72 hours. Lipid Profile No results for input(s): CHOL, HDL, LDLCALC, TRIG, CHOLHDL, LDLDIRECT in the last 72 hours. Thyroid function studies No results for input(s): TSH, T4TOTAL, T3FREE, THYROIDAB in the last 72 hours.  Invalid input(s): FREET3 Anemia work up No results for input(s): VITAMINB12, FOLATE, FERRITIN, TIBC, IRON, RETICCTPCT in the last 72 hours. Urinalysis    Component Value Date/Time   COLORURINE AMBER (A) 09/09/2018 0648    APPEARANCEUR CLOUDY (A) 09/09/2018 0648   LABSPEC 1.023 09/09/2018 0648   PHURINE 5.0 09/09/2018 0648   GLUCOSEU NEGATIVE 09/09/2018 0648   HGBUR SMALL (A) 09/09/2018 0648   BILIRUBINUR NEGATIVE 09/09/2018 0648   KETONESUR NEGATIVE 09/09/2018 0648   PROTEINUR NEGATIVE 09/09/2018 0648   NITRITE NEGATIVE 09/09/2018 0648   LEUKOCYTESUR LARGE (A) 09/09/2018 0648   Sepsis Labs Invalid input(s): PROCALCITONIN,  WBC,  LACTICIDVEN Microbiology Recent Results (from the past 240 hour(s))  SARS Coronavirus 2 (CEPHEID - Performed in [facility] hospital lab), Hosp Order     Status: None   Collection Time: 09/09/18  6:48 AM   Specimen: Nasopharyngeal Swab  Result Value Ref Range Status   SARS Coronavirus 2 NEGATIVE NEGATIVE Final    Comment: (NOTE) If result is NEGATIVE SARS-CoV-2 target nucleic acids are NOT DETECTED. The SARS-CoV-2 RNA is generally detectable in upper and lower  respiratory specimens during the acute phase of infection. The lowest  concentration of SARS-CoV-2 viral copies this assay can detect is 250  copies / mL. A negative result does not preclude SARS-CoV-2 infection  and should not be used as the sole basis for treatment or other  patient management decisions.  A negative result may occur with  improper specimen collection / handling, submission of specimen other  than nasopharyngeal swab, presence of viral mutation(s) within the  areas targeted by this assay, and inadequate number of viral copies  (<250 copies / mL). A negative result must be combined with clinical  observations, patient history, and epidemiological information. If result is POSITIVE SARS-CoV-2 target nucleic acids are DETECTED. The SARS-CoV-2 RNA is generally detectable in upper and lower  respiratory specimens dur ing the acute phase of infection.  Positive  results are indicative of active infection with SARS-CoV-2.  Clinical  correlation with patient history and other diagnostic information is   necessary to determine patient infection status.  Positive results do  not rule out bacterial infection or co-infection with other viruses. If result is PRESUMPTIVE POSTIVE SARS-CoV-2 nucleic acids MAY BE PRESENT.   A presumptive positive result was obtained on the submitted specimen  and confirmed on repeat testing.  While 2019 novel coronavirus  (SARS-CoV-2) nucleic acids may be present in the submitted sample  additional confirmatory testing may be necessary for epidemiological  and / or clinical management purposes  to differentiate between  SARS-CoV-2 and other Sarbecovirus currently known to infect humans.  If clinically indicated additional testing with an alternate test  methodology [phone] is advised. The SARS-CoV-2 RNA is generally  detectable in upper and lower respiratory sp ecimens during the acute  phase of infection. The expected result is Negative. Fact Sheet for Patients:  BoilerBrush.com.cy Fact Sheet for Healthcare Providers: https://pope.com/ This test is not yet approved or cleared by the [place] FDA and has been authorized for detection and/or diagnosis of SARS-CoV-2 by FDA under an Emergency Use Authorization (EUA).  This EUA will remain in effect (meaning this test can be used) for the duration of the COVID-19 declaration under Section 564(b)(1) of the Act, 21 U.S.C. section 360bbb-3(b)(1), unless the authorization is terminated or revoked sooner. Performed at [facility], [address], [place], [place] [address]   Blood culture (routine x 2)     Status: None   Collection Time: 09/09/18  6:53 AM   Specimen: BLOOD  Result Value Ref Range Status   Specimen Description   Final    BLOOD RIGHT ANTECUBITAL Performed at [facility], [address] [place], [place] [address]    Special Requests   Final    BOTTLES DRAWN AEROBIC AND ANAEROBIC Blood Culture adequate  volume Performed at [facility], [address] [place], [place] [address]    Culture   Final    NO GROWTH 5 DAYS Performed at [facility], [address], [place], [place] [address]    Report Status 09/14/2018 FINAL  Final  MRSA PCR Screening     Status: Abnormal   Collection Time: 09/09/18  2:02 PM   Specimen: Nasal Mucosa; Nasopharyngeal  Result Value Ref Range Status   MRSA by PCR POSITIVE (A) NEGATIVE Final    Comment:        The GeneXpert MRSA Assay (FDA approved for NASAL specimens only), is one component of a comprehensive MRSA colonization surveillance program. It is not intended to diagnose MRSA infection nor to guide or monitor treatment for MRSA infections. RESULT CALLED TO, READ BACK BY AND VERIFIED WITH: PULEO,R. RN  ON 07.20.2020 BY COHEN,K Performed at [facility], [address] [place], [place] [address]   Culture, respiratory (non-expectorated)     Status: None   Collection Time: 09/09/18  3:00 PM   Specimen: Tracheal Aspirate; Respiratory  Result Value Ref Range Status   Specimen Description   Final    TRACHEAL ASPIRATE Performed at [facility], [address] [place], [place] [address]    Special Requests   Final    NONE Performed at [facility], [address] [place], [place] [address]    Gram Stain   Final  ABUNDANT WBC PRESENT, PREDOMINANTLY PMN MODERATE YEAST FEW GRAM POSITIVE COCCI IN PAIRS FEW GRAM NEGATIVE RODS    Culture   Final    Consistent with normal respiratory flora. Performed at [facility], [address] [place], [place] [address]    Report Status 09/12/2018 FINAL  Final  Blood culture (routine x 2)     Status: None   Collection Time: 09/09/18  5:21 PM   Specimen: BLOOD RIGHT HAND  Result Value Ref Range Status   Specimen Description   Final    BLOOD RIGHT HAND Performed at [facility], [address],  [place], [place] [address]    Special Requests   Final    BOTTLES DRAWN AEROBIC ONLY Blood Culture results may not be optimal due to an inadequate volume of blood received in culture bottles Performed at [facility], [address], [place], [place] [address]    Culture   Final    NO GROWTH 5 DAYS Performed at [facility], [address] [place], [place] [address]    Report Status 09/14/2018 FINAL  Final  C difficile quick scan w PCR reflex     Status: None   Collection Time: 09/11/18  4:15 AM   Specimen: STOOL  Result Value Ref Range Status   C Diff antigen NEGATIVE NEGATIVE Final   C Diff toxin NEGATIVE NEGATIVE Final   C Diff interpretation No C. difficile detected.  Final    Comment: Performed at [facility], [address], [place], [place] [address]     Time coordinating discharge: 35 minutes  SIGNED:   Lanae Boast, MD  [facility] 09/16/2018, 11:50 AM  If 7PM-7AM, please contact night-coverage [email or web address]

## 2018-09-16 NOTE — Progress Notes (Signed)
Nutrition Brief Note  Chart reviewed. Pt now transitioning to comfort care.  No further nutrition interventions warranted at this time.   Cesario Weidinger, MS, RD, LDN [facility] Inpatient Clinical Dietitian Pager: [phone] After Hours Pager: [phone]   

## 2018-09-16 NOTE — Care Management Important Message (Signed)
Important Message  Patient Details IM Letter given to Velva Harman RN to present to the Patient Name: Taylor Ruiz MRN: [id number] Date of Birth: 12/23/1935   Medicare Important Message Given:  Yes     Kerin Salen 09/16/2018, 10:19 AM

## 2018-09-17 NOTE — Progress Notes (Signed)
Report called to Helene Kelp at [facility], [place] notified of transport

## 2018-09-17 NOTE — TOC Progression Note (Signed)
Transition of Care [facility]) - Progression Note    Patient Details  Name: Taylor Ruiz MRN: [id number] Date of Birth: [birth year]  [facility] CM/SW Contact  Leeroy Cha, RN Phone Number: 09/17/2018, 11:52 AM  Clinical Narrative:    tct-[name]/having a conference call today at 1200pm with Erling Conte of [facility], to get paperwork done.   Expected Discharge Plan: Skilled Nursing Facility Barriers to Discharge: Family Issues(see notes)  Expected Discharge Plan and Services Expected Discharge Plan: [place]   Discharge Planning Services: CM Consult   Living arrangements for the past 2 months: Skilled Nursing Facility Expected Discharge Date: 09/16/18                                     Social Determinants of Health (SDOH) Interventions    Readmission Risk Interventions Readmission Risk Prevention Plan 09/10/2018  Transportation Screening Complete  PCP or Specialist Appt within 3-5 Days Not Complete  Not Complete comments Not close to discharge  [place] or [facility] Not Complete  HRI or Home Care Consult comments From SNF  Social Work Consult for [place] Planning/Counseling Not Complete  SW consult not completed comments NA  Palliative Care Screening Complete  Medication Review [employer]) Complete

## 2018-09-17 NOTE — Progress Notes (Signed)
[employer]  Received request from [place] for family interest in [place]. Chart reviewed. Left message for [place]. Will follow up again this morning and update hospital staff.   Thank you,  Erling Conte, LCSW [phone]

## 2018-09-17 NOTE — Progress Notes (Signed)
[employer] completed with POA Hal Smith via [employer].   Discharge summary has been sent.   RN please call report to [facility] prior to patient leaving the unit at [phone].  Thank you,  Erling Conte, LCSW [phone]

## 2018-10-22 DEATH — deceased

## 2020-05-02 IMAGING — CR CHEST - 2 VIEW
2 series · 2 of 2 positions shown · non-contrast
Comparison: None.

CLINICAL DATA: Recent syncopal episode

EXAM:
CHEST - 2 VIEW

[w chest lat]
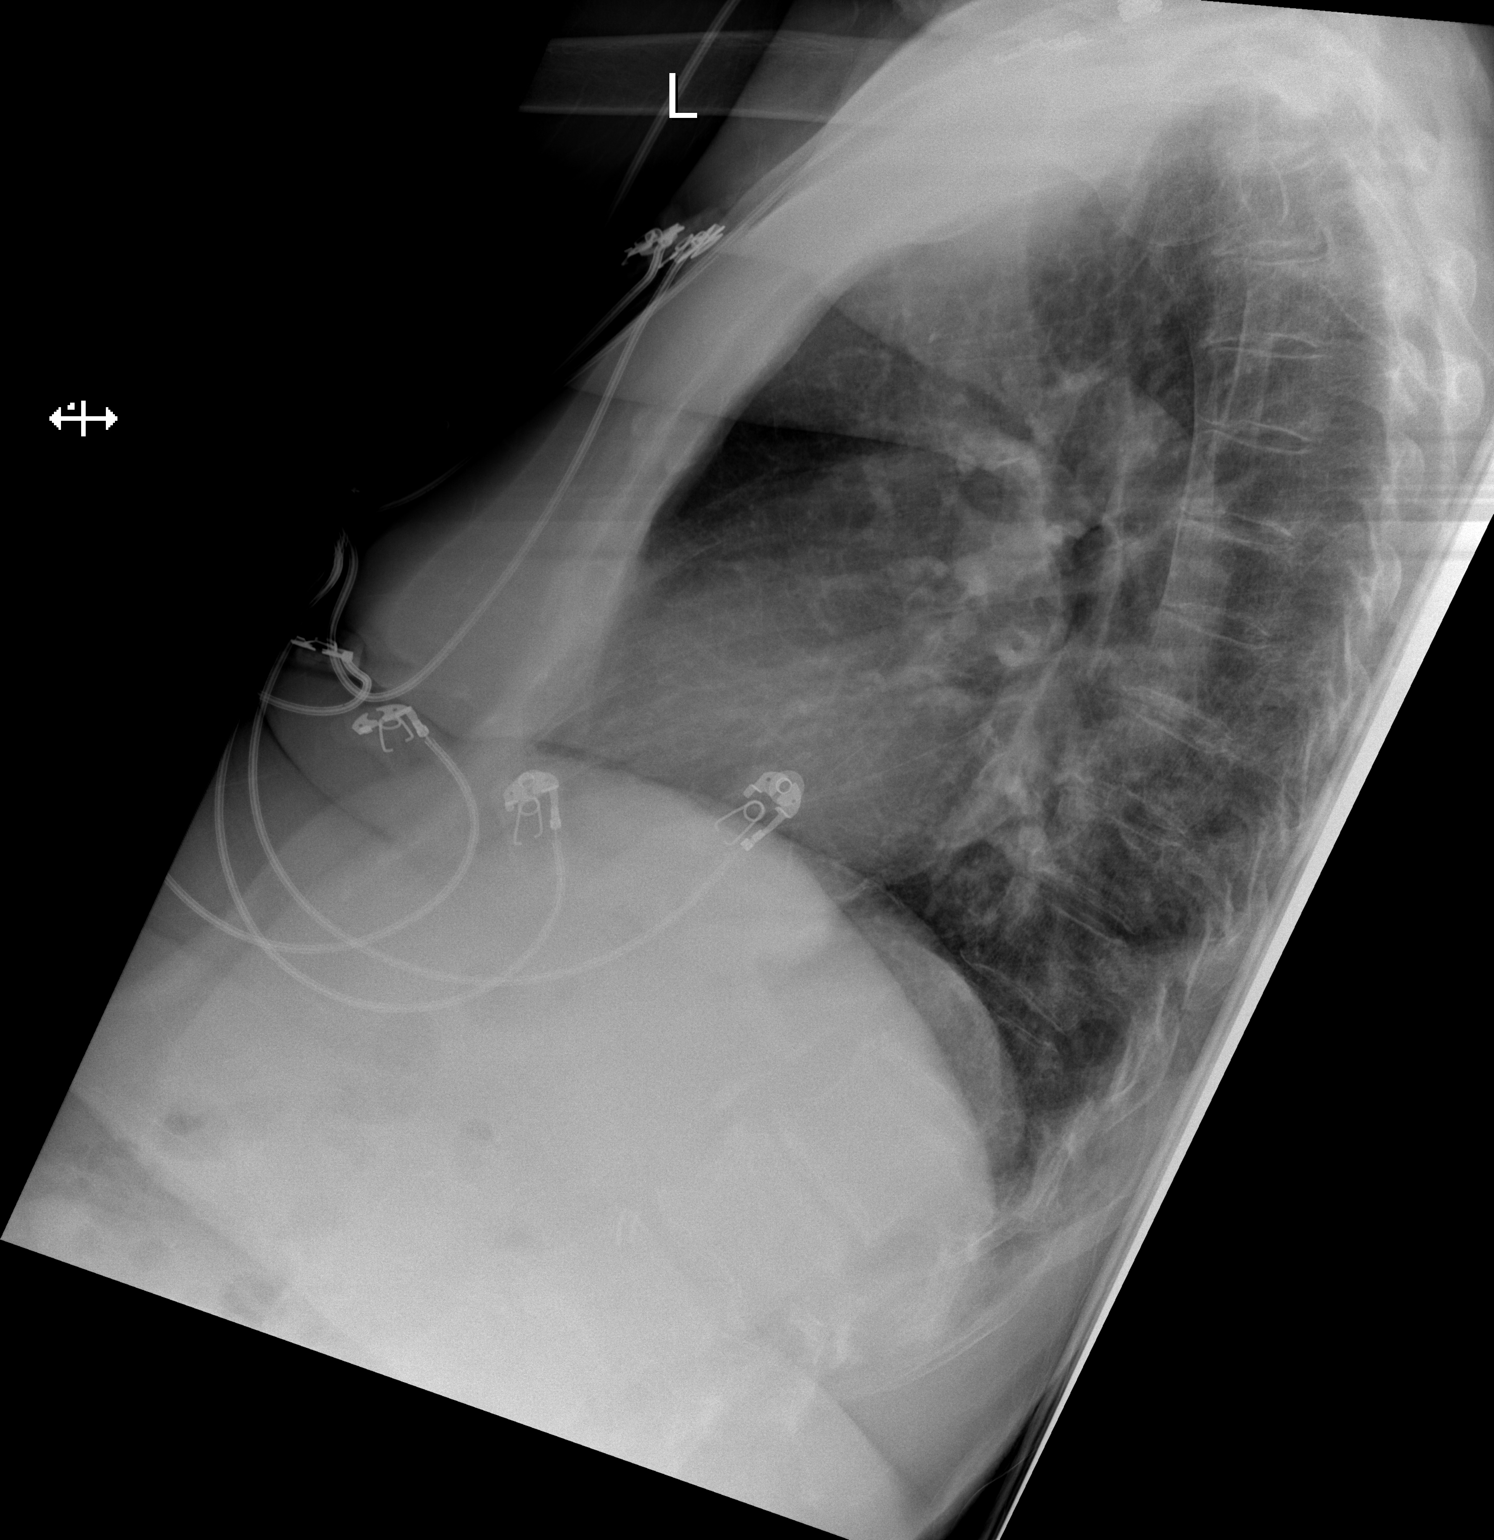

[x chest ap]
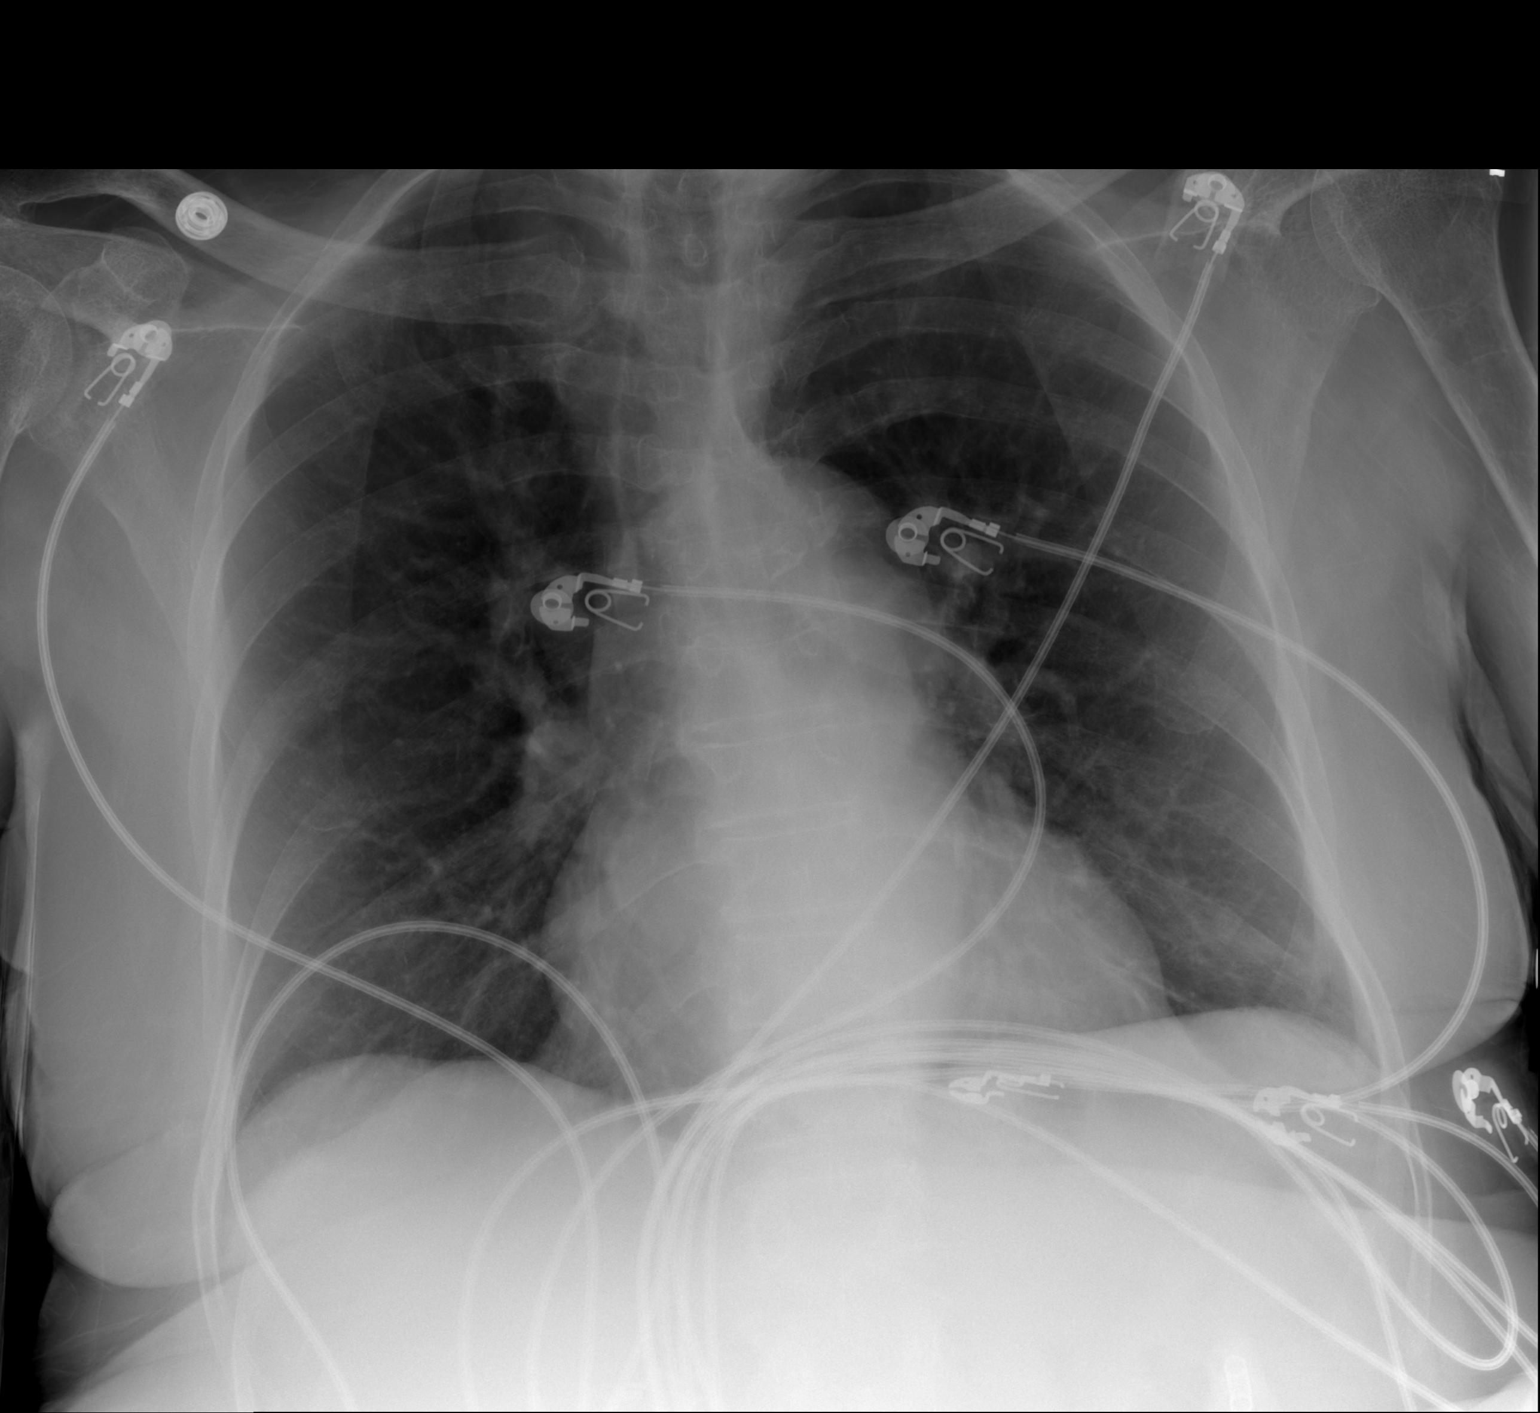

[2 of 2 positions shown; findings below may reference images not displayed]

FINDINGS: The heart size and mediastinal contours are within normal limits.
Both lungs are clear. The visualized skeletal structures are
unremarkable.
IMPRESSION: No active cardiopulmonary disease.

## 2020-07-18 IMAGING — DX PORTABLE CHEST - 1 VIEW
1 series · 1 of 1 positions shown · non-contrast
Comparison: 09/09/2018

CLINICAL DATA: ET tube placement

EXAM:
PORTABLE CHEST 1 VIEW

[chest ap]
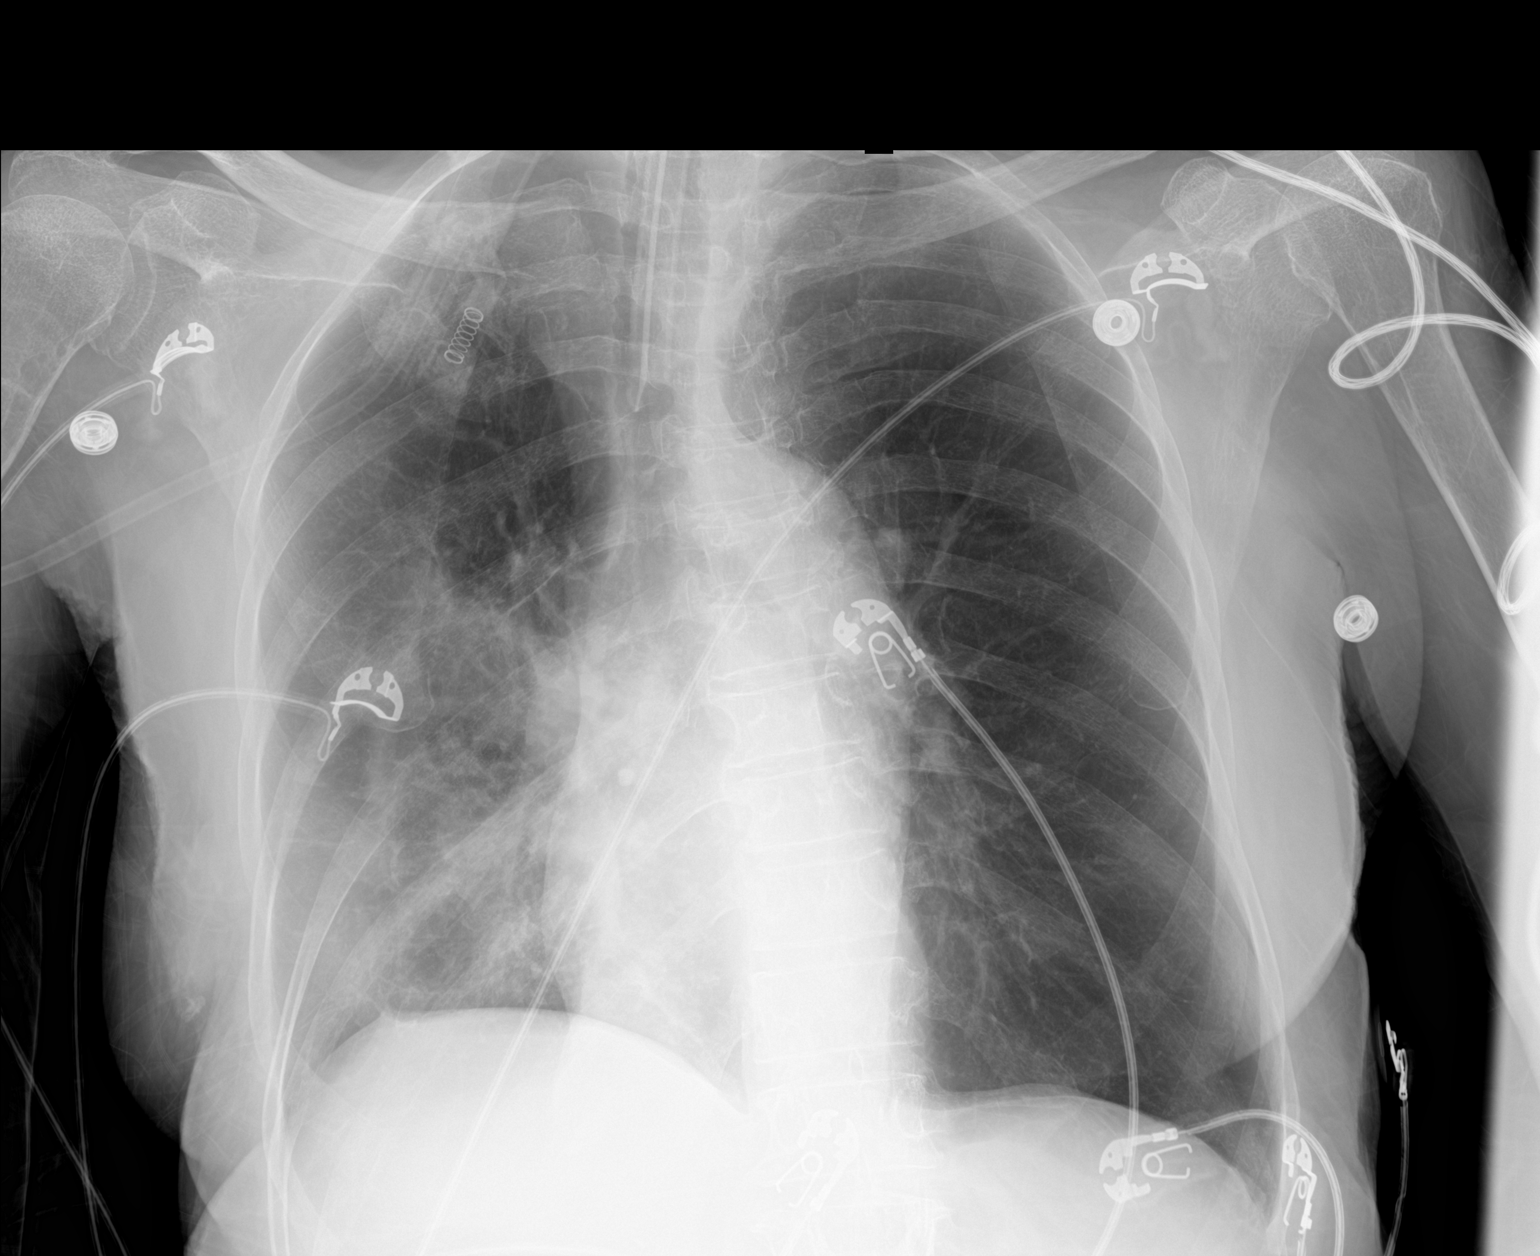

[1 of 1 positions shown; findings below may reference images not displayed]

FINDINGS: Endotracheal tube is 4.6 cm above the carina. Heart is normal size.
Airspace disease in the right mid and lower lung similar to prior
study. Left lung clear. No effusions or acute bony abnormality.
IMPRESSION: Endotracheal tube in expected position.

Persistent right mid and lower lung infiltrate.  No real change.

## 2020-07-20 IMAGING — DX PORTABLE ABDOMEN - 1 VIEW
1 series · 1 of 1 positions shown · non-contrast
Comparison: 09/09/2018

CLINICAL DATA: Check feeding catheter placement

EXAM:
PORTABLE ABDOMEN - 1 VIEW

[abdomen kub]
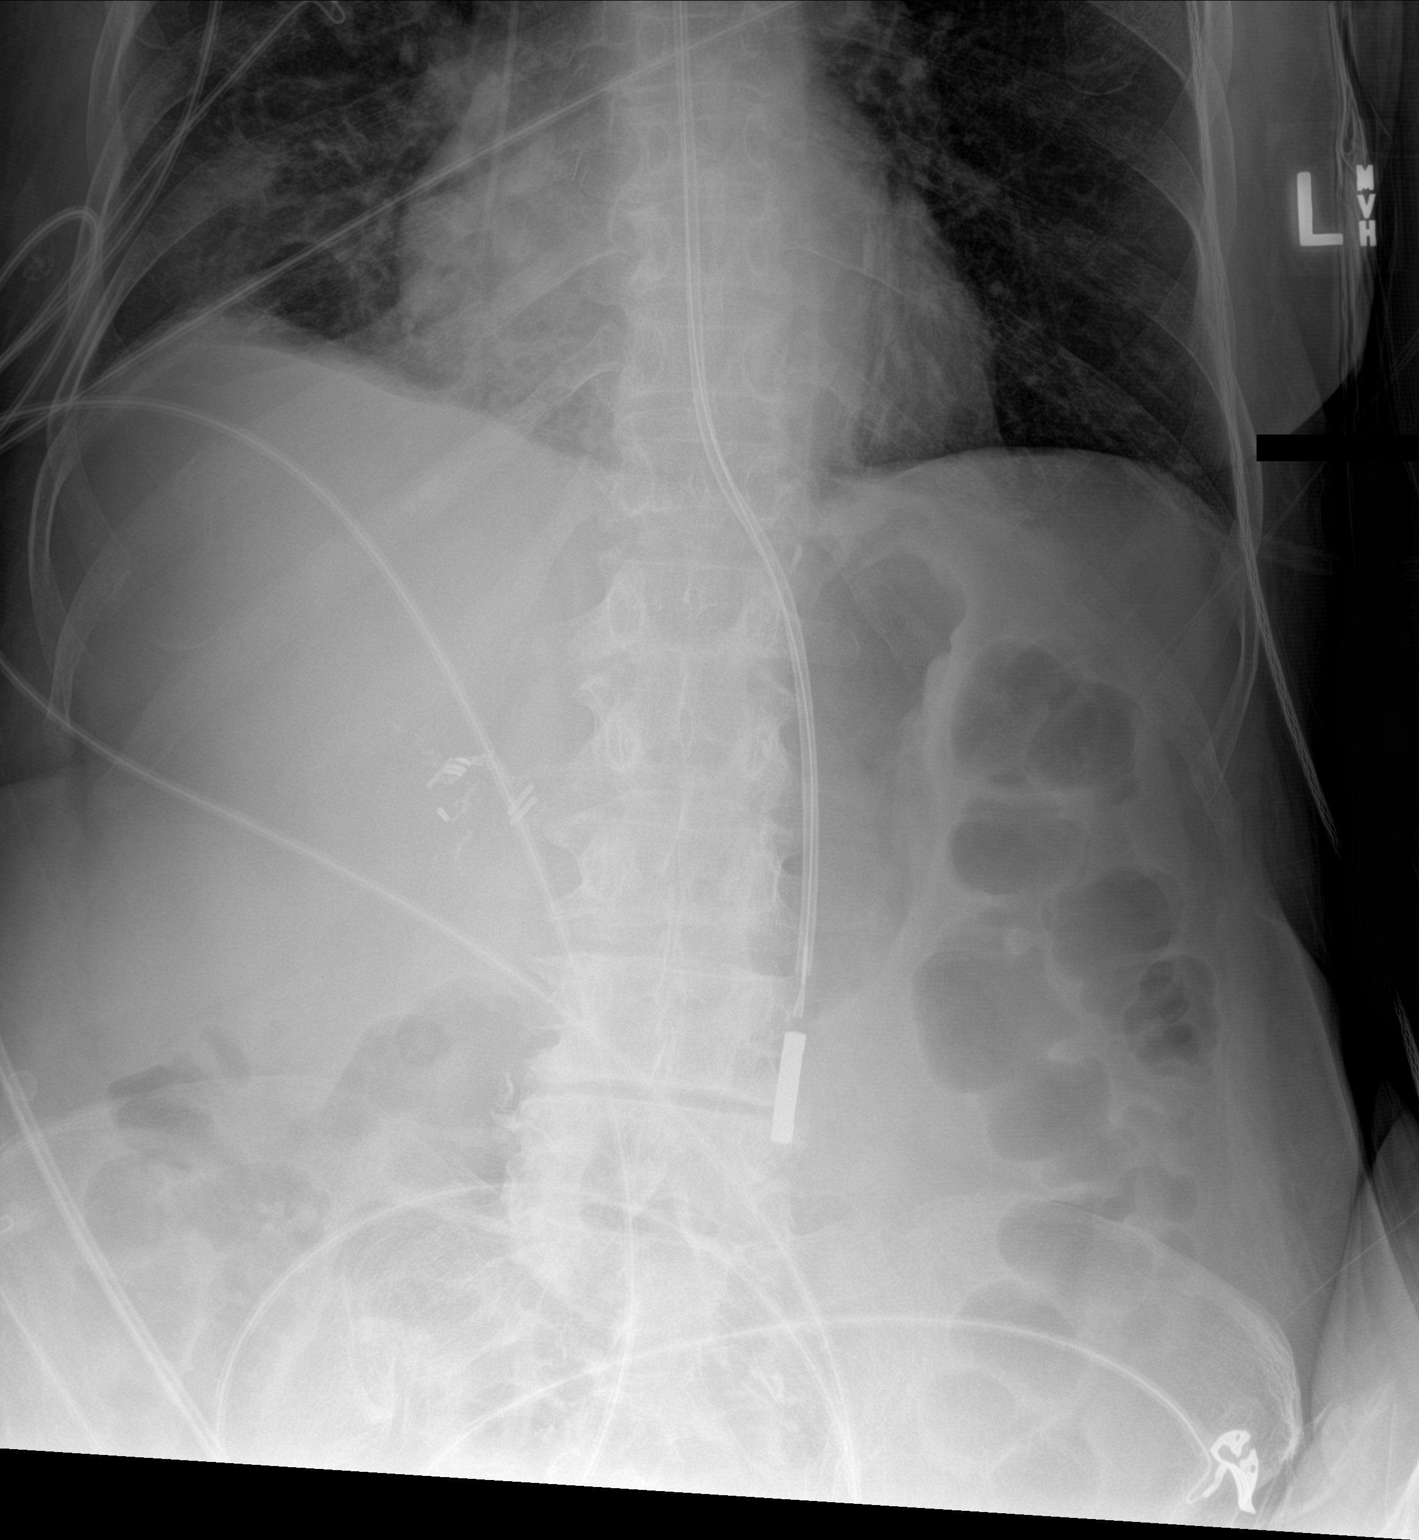

[1 of 1 positions shown; findings below may reference images not displayed]

FINDINGS: Previously seen gastric catheter has been removed and a weighted
feeding catheter has now been placed and lies in the mid stomach.
The remainder of the exam is stable.
IMPRESSION: Weighted feeding catheter now lies in the mid stomach.

## 2020-07-21 IMAGING — DX PORTABLE CHEST - 1 VIEW
1 series · 1 of 1 positions shown · non-contrast
Comparison: Single-view of the chest 09/09/2018, 07/24/2018.

CLINICAL DATA: Patient admitted 09/09/2018 with encephalopathy and
hypoxic respiratory failure.

EXAM:
PORTABLE CHEST 1 VIEW

[chest ap]
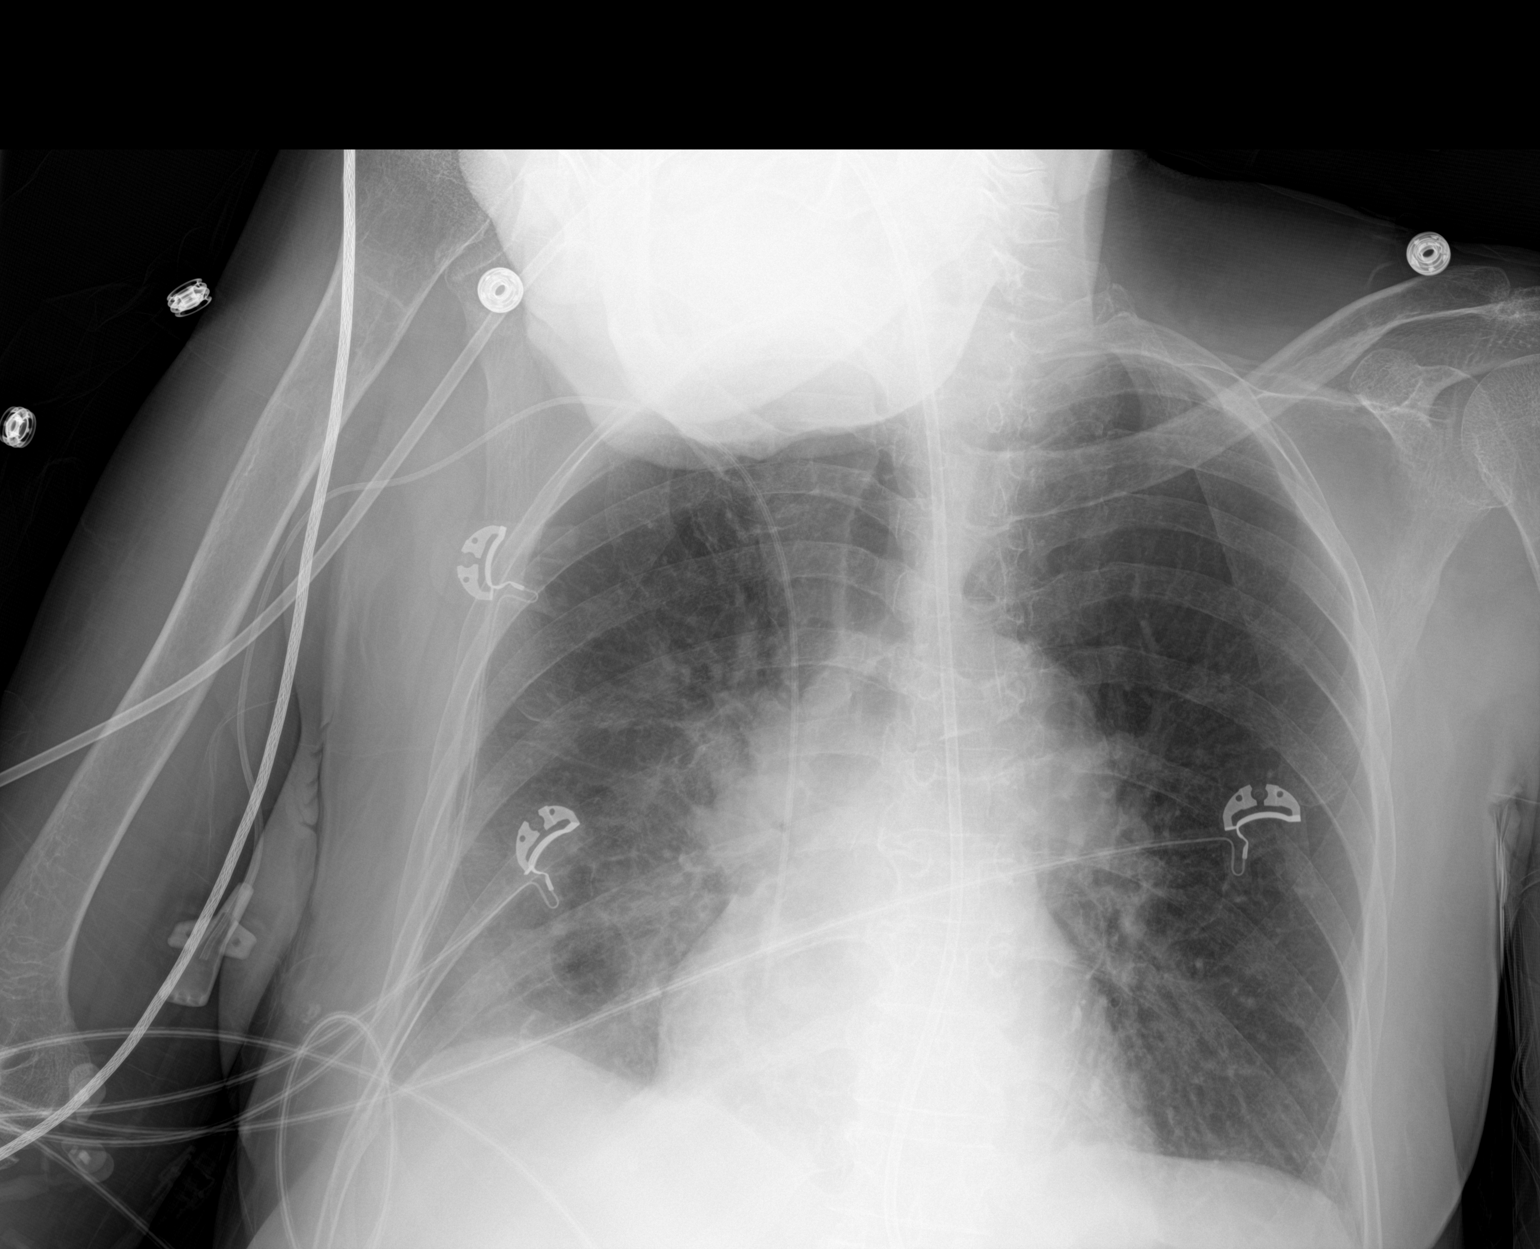

[1 of 1 positions shown; findings below may reference images not displayed]

FINDINGS: Endotracheal tube is no longer visualized. Feeding tube and right
PICC are unchanged. Right basilar airspace disease persists without
notable change. Left lung is clear. Heart size is normal.
IMPRESSION: Endotracheal tube is no longer seen. New feeding tube courses into
the stomach and below the inferior margin the film.

No change in right basilar airspace disease.
